# Patient Record
Sex: Female | Born: 1935 | Race: White | Hispanic: No | Marital: Single | Smoking: Never smoker
Health system: Southern US, Community
[De-identification: ages and names within clinical notes are randomized; demographics above are authoritative.]

## PROBLEM LIST (undated history)

## (undated) DIAGNOSIS — I10 Essential (primary) hypertension: Secondary | ICD-10-CM

## (undated) DIAGNOSIS — Z96649 Presence of unspecified artificial hip joint: Secondary | ICD-10-CM

## (undated) DIAGNOSIS — Z9882 Breast implant status: Secondary | ICD-10-CM

## (undated) DIAGNOSIS — M1990 Unspecified osteoarthritis, unspecified site: Secondary | ICD-10-CM

---

## 2011-10-18 ENCOUNTER — Ambulatory Visit (HOSPITAL_COMMUNITY): Payer: Medicare Other

## 2011-10-18 ENCOUNTER — Observation Stay (HOSPITAL_COMMUNITY)
Admission: RE | Admit: 2011-10-18 | Discharge: 2011-10-19 | Disposition: A | Discharge status: Home / Self Care | Payer: Medicare Other | Source: Ambulatory Visit | Attending: Otolaryngology | Admitting: Otolaryngology

## 2011-10-18 ENCOUNTER — Other Ambulatory Visit: Payer: Self-pay | Admitting: Otolaryngology

## 2011-10-18 DIAGNOSIS — Z01818 Encounter for other preprocedural examination: Secondary | ICD-10-CM | POA: Insufficient documentation

## 2011-10-18 DIAGNOSIS — J322 Chronic ethmoidal sinusitis: Secondary | ICD-10-CM | POA: Insufficient documentation

## 2011-10-18 DIAGNOSIS — J342 Deviated nasal septum: Secondary | ICD-10-CM | POA: Insufficient documentation

## 2011-10-18 DIAGNOSIS — Z01812 Encounter for preprocedural laboratory examination: Secondary | ICD-10-CM | POA: Insufficient documentation

## 2011-10-18 DIAGNOSIS — Z01810 Encounter for preprocedural cardiovascular examination: Secondary | ICD-10-CM | POA: Insufficient documentation

## 2011-10-18 DIAGNOSIS — J343 Hypertrophy of nasal turbinates: Secondary | ICD-10-CM | POA: Insufficient documentation

## 2011-10-18 DIAGNOSIS — G4730 Sleep apnea, unspecified: Principal | ICD-10-CM | POA: Insufficient documentation

## 2011-10-18 LAB — BASIC METABOLIC PANEL
BUN: 15 mg/dL (ref 6–23)
CO2: 30 mEq/L (ref 19–32)
Chloride: 100 mEq/L (ref 96–112)
Glucose, Bld: 93 mg/dL (ref 70–99)
Potassium: 4.6 mEq/L (ref 3.5–5.1)
Sodium: 138 mEq/L (ref 135–145)

## 2011-10-18 LAB — DIFFERENTIAL
Basophils Absolute: 0 10*3/uL (ref 0.0–0.1)
Basophils Relative: 0 % (ref 0–1)
Eosinophils Absolute: 0.1 K/uL (ref 0.0–0.7)
Eosinophils Relative: 2 % (ref 0–5)
Lymphocytes Relative: 29 % (ref 12–46)
Lymphs Abs: 1.9 K/uL (ref 0.7–4.0)
Monocytes Absolute: 0.6 10*3/uL (ref 0.1–1.0)
Monocytes Relative: 9 % (ref 3–12)
Neutro Abs: 4 10*3/uL (ref 1.7–7.7)
Neutrophils Relative %: 60 % (ref 43–77)

## 2011-10-18 LAB — CBC
HCT: 41.9 % (ref 36.0–46.0)
Hemoglobin: 13.4 g/dL (ref 12.0–15.0)
MCH: 28.5 pg (ref 26.0–34.0)
MCHC: 32 g/dL (ref 30.0–36.0)
MCV: 89.1 fL (ref 78.0–100.0)
Platelets: UNDETERMINED K/uL (ref 150–400)
RBC: 4.7 MIL/uL (ref 3.87–5.11)
RDW: 12.6 % (ref 11.5–15.5)
WBC: 6.7 K/uL (ref 4.0–10.5)

## 2011-10-18 LAB — BASIC METABOLIC PANEL WITH GFR
Calcium: 9.5 mg/dL (ref 8.4–10.5)
Creatinine, Ser: 0.91 mg/dL (ref 0.50–1.10)
GFR calc Af Amer: 70 mL/min — ABNORMAL LOW (ref >90)
GFR calc non Af Amer: 61 mL/min — ABNORMAL LOW (ref >90)

## 2011-10-18 LAB — SURGICAL PCR SCREEN
MRSA, PCR: NEGATIVE
Staphylococcus aureus: NEGATIVE

## 2011-10-19 NOTE — Progress Notes (Deleted)
[id number] 

## 2011-10-21 NOTE — Op Note (Signed)
Charlene Ellis, Charlene Ellis                  ACCOUNT NO.:  [id number]  MEDICAL RECORD NO.:  [id number]  LOCATION:  SDSC                         FACILITY:  [facility]  PHYSICIAN:  Hermelinda Medicus, M.D.   DATE OF BIRTH:  [birth year]  DATE OF PROCEDURE:  10/18/2011 DATE OF DISCHARGE:                              OPERATIVE REPORT   PREOPERATIVE DIAGNOSES:  Failed continuous positive airway pressure with sleep apnea.  Lowest SaO2 was 78.  Apnea-hypopnea index was 95.9.  The setting of continuous positive airway pressure was 17 cm of water, indicating severe nasal obstruction, and in fact, she has a septal deviation, sinusitis left ethmoid, turbinate hypertrophy, and a very low uvula and palate.  POSTOPERATIVE DIAGNOSES:  Failed continuous positive airway pressure with sleep apnea.  Lowest SaO2 was 78.  Apnea-hypopnea index was 95.9. The setting of continuous positive airway pressure was 17 cm of water, indicating severe nasal obstruction, and in fact, she has a septal deviation, sinusitis left ethmoid, turbinate hypertrophy, and a very low uvula and palate.  OPERATION:  A septal reconstruction, turbinate reduction, a left anterior ethmoidectomy, and a uvulopalatoplasty under general endotracheal anesthesia.  SURGEON:  Hermelinda Medicus, MD  ANESTHESIOLOGIST:  Dr. Gelene Mink.  The patient is aware of risks and gains.  She is going to be kept in a step-down unit postop just to check her O2 postoperatively, but the gain in her airway should be considerable and we also will use anesthesia trumpet to protect her airway during this initial wake-up time.  She will have some pain in her palate and uvula, which will last approximately 10 days and she will have to be on a soft diet.  We hope with this we will able to avoid the CPAP totally.  If we need CPAP in the future in the setting of the CPAP should be markedly improved approaching 7-8 or 9 cm of water which is more typical.  DESCRIPTION OF PROCEDURE:   The patient was in supine position under general endotracheal anesthesia, the patient was prepped and draped in the appropriate using Hibiclens and usual head drape.  The nose was first anesthetized with 1% Xylocaine with epinephrine and topical cocaine 200 mg, and the inferior turbinates were aggressively outfractured gaining considerable space at this time.  We then made a hemitransfixion incision on the left side and worked back taking down a very heavily deviated ethmoid and vomerine septum using the open and closed Jansen-Middleton Takahashi forceps and then the 4-mm chisel was used to take down the vomerine spur and that once we accomplished that we were to bring the septum back to the midline and gained considerable space.  We also used the Elmed to decrease the turbinate hypertrophy. We closed the septal deviation with 4-0 through and through septal suture and then we approached the left ethmoid using the 0 degree scope and pushed the middle turbinate medial, entered the ethmoid finding considerable polypoid debris within the ethmoid sinus.  Once we accomplished this, we then placed a Gelfoam within the ethmoid sinus. To keep the turbinate medial, to keep the sinus opening.  We then reoriented herself to the palate where using the Crowe-Davis mouth gag. We  trimmed the uvula and the palate gaining about 7 mm of height in an effort to get a better oral airway as she did have a rather large high tongue and a fairly small mouth.  The patient tolerated this well.  All hemostasis was established with Bovie electrocoagulation.  Once this was completed, we then removed the Crowe-Davis mouth gag and reordered ourselves to the nose where any bleeding was checked.  The bleeding was also checked around the palate and then we placed a Merocel pack on the left, a 10 cm, and then 7 anesthesia trumpet on the right to ensure her airway.  She was taken to recovery room in good condition. Blood  loss was approximately 20 mL.  She will be kept in the Step-Down Unit for postop observation of her sleep apnea, and hopefully she will be a 23- hour observation.  Her follow up will be in 3 days a week, 2 weeks, 3 weeks, 6 weeks, and 6 months a year.          ______________________________ Hermelinda Medicus, M.D.     JC/MEDQ  D:  10/18/2011  T:  10/18/2011  Job:  [id number]  cc:   Dr. Feliciana Rossetti Dr. Alton Revere  Electronically Signed by Hermelinda Medicus M.D. on 10/21/2011 11:12:43 AM

## 2011-10-21 NOTE — H&P (Signed)
  NAMEDAKARI, STABLER                  ACCOUNT NO.:  [id number]  MEDICAL RECORD NO.:  [id number]  LOCATION:  SDSC                         FACILITY:  [facility]  PHYSICIAN:  Hermelinda Medicus, M.D.   DATE OF BIRTH:  1935-01-27  DATE OF ADMISSION:  10/18/2011 DATE OF DISCHARGE:                             HISTORY & PHYSICAL   HISTORY OF PRESENT ILLNESS:  This patient is a 76 year old female who has had great difficulty with her sleep apnea issues.  She was set up with CPAP but had a set at 17 pressure and pressure is a centimeter of water.  She also had a 95.9 AHI with 78% O2 nadir.  She could not tolerate this as she had a severe septal deviation, and a turbinate hypertrophy.  She also has left ethmoid sinusitis.  She also has a very low uvula and long uvula and palate typical of a snore.  She has abandon her CPAP because she just cannot use it.  She now enters for septal reconstruction to reestablish her nasal airway with turbinate reduction as well as a left anterior ethmoidectomy as well as a uvulopalatoplasty. Her past history is unremarkable and the fact she has no allergies to medications.  She has had a hysterectomy, bilateral breast surgery in after birth, and she had a T and A at age 36 and bilateral cataract surgery.  She also never smoked or drink.  She has had no cardiac problems.  She has never had a stress test.  No echocardiogram.  She does take OxyContin if she does have some neck cervical C5-6 disk issues.  Apparently, there is some arthritis there.  She also was stated that she may have some fibromyalgia.  She told decrease in her sleep or have sleep deprivation that she is somewhat depressed and has been on medication for this.  Her lab is unremarkable.  She has normal lab status and her EKG showed a normal sinus rhythm.  PHYSICAL EXAMINATION:  GENERAL:  She is a well-nourished, somewhat anxious lady. VITAL SIGNS:  Blood pressure of 131/84, pulse 73, SaO2 resting is  93%. HEENT:  She has a septal deviation, turbinate hypertrophy with left ethmoid sinusitis as seen on CAT scan.  She has a very low palate and uvula, quite large tongue. CHEST:  Clear.  No rales, rhonchi, or wheezes. CARDIOVASCULAR:  Normal S1 and S2.  No murmurs or gallops with normal EKG. EXTREMITIES:  Essentially are unremarkable.  Her initial diagnosis is sleep apnea with failed CPAP with severe septal deviation, turbinate hypertrophy, left ethmoid sinusitis with a low palate and uvula with sleep deprivation.  History of cataract surgery. History of tonsillectomy.  History of breast surgery as a child.  Our plan is to do a septal reconstruction, turbinate reduction, left anterior ethmoidectomy and uvulopalatoplasty.          ______________________________ Hermelinda Medicus, M.D.     JC/MEDQ  D:  10/18/2011  T:  10/18/2011  Job:  [id number]  cc:   Feliciana Rossetti, MD Albertina Senegal, PA  Electronically Signed by Hermelinda Medicus M.D. on 10/21/2011 11:12:47 AM

## 2011-10-21 NOTE — Discharge Summary (Signed)
Charlene Ellis, Charlene Ellis                  ACCOUNT NO.:  [id number]  MEDICAL RECORD NO.:  [id number]  LOCATION:  3307                         FACILITY:  [facility]  PHYSICIAN:  Hermelinda Medicus, M.D.   DATE OF BIRTH:  03/18/1935  DATE OF ADMISSION:  10/18/2011 DATE OF DISCHARGE:  10/19/2011                              DISCHARGE SUMMARY   This patient is a 76 year old female who has had great difficulty with her sleep apnea issues.  She was set up with CPAP, was set at 17 pressure and cm of water.  She also had a 95.9 AHI with 78% O2 nadir and could not tolerate her CPAP because of the high pressure.  It was set that high because she has a very deviated septum and blocking of her nose, has turbinate hypertrophy.  She has abandoned the CPAP and just given up on it and just gets a very poor night's sleep.  Her polysomnogram showed she had no time in deep or in REM sleep, and she is chronically fatigued with sleep deprivation.  She now enters for a septal reconstruction and anterior ethmoidectomy as the CAT scan done of her sinuses has shown ethmoid sinusitis, and she will have a uvulopalatoplasty to upgrade or to increase the elevation of her palate so that her high-arched tongue will not cause obstruction in the oral cavity.  Her past history is quite unremarkable.  She has no medication allergies.  She has had a hysterectomy, bilateral breast surgery, T and A as a child at 55, bilateral cataract surgeries.  She has never smoked or drunk and she has no cardiac problems with a normal EKG, but she has not had a stress test or an echocardiogram.  She does take OxyContin because she has neck or cervical C5 __________ disk issues but has never had surgery there.  She also has some arthritis.  She also has a history of fibromyalgia.  With the sleep deprivation, I think she is having more fibromyalgia issues, also some depression issues.  Her EKG is within normal limits.  Her laboratory is also  quite excellent.  She has a white count of 6.7, hemoglobin of 13.4, hematocrit 41.9, and a normal differential.  Her sodium 138, potassium 4.6.  She has also had otherwise a normal status except for her GFR which was 61.  She also has no MRSA history.  She underwent a septal reconstruction, turbinate reduction with a left anterior ethmoidectomy and a uvulopalatoplasty under general endotracheal anesthesia, and had a __________ packing in her right nose and a Merocel in the left, and she was observed on the floor of 3300 which is a step-down unit because of her sleep apnea and found to have a O2 nadir of 90 but primarily 94-96% on room air.  Her blood pressure has been stable between 127/61 and 158/61.  She had done extremely well. Her pack has been removed this morning.  She will be followed in my office tomorrow just for cleansing of her nose, and she will then be followed in 1 week, 3 weeks, 6 weeks, 3 months, and 6 months.  FINAL DIAGNOSIS: 1. Sleep apnea with septal deviation with left ethmoid  sinusitis with     redundant palate with failed continuous positive airway pressure. 2. History of breast surgery. 3. History of tonsillectomy and adenoidectomy. 4. History of C5-C6 arthritis. 5. History of fibromyalgia. 6. History of a mild depression.  Her situation is markedly improved, breathing is far better.  She is already much happier than preop, and she has a normal EKG.  I discussed with family and her daughter who will care postoperatively, and I am expecting her to do extremely well.          ______________________________ Hermelinda Medicus, M.D.     JC/MEDQ  D:  10/19/2011  T:  10/19/2011  Job:  [id number]  cc:   Dr. Feliciana Rossetti, [facility] Dr. Albertina Senegal, [facility]  Electronically Signed by Hermelinda Medicus M.D. on 10/21/2011 11:12:51 AM

## 2015-07-17 ENCOUNTER — Encounter (HOSPITAL_COMMUNITY): Payer: Self-pay | Admitting: Radiology

## 2015-07-17 ENCOUNTER — Inpatient Hospital Stay (HOSPITAL_COMMUNITY): Payer: PPO

## 2015-07-17 ENCOUNTER — Inpatient Hospital Stay (HOSPITAL_COMMUNITY)
Admission: EM | Admit: 2015-07-17 | Discharge: 2015-08-19 | DRG: 064 | Disposition: E | Payer: PPO | Attending: Pulmonary Disease | Admitting: Pulmonary Disease

## 2015-07-17 ENCOUNTER — Emergency Department (HOSPITAL_COMMUNITY): Payer: PPO

## 2015-07-17 DIAGNOSIS — R739 Hyperglycemia, unspecified: Secondary | ICD-10-CM | POA: Diagnosis not present

## 2015-07-17 DIAGNOSIS — I10 Essential (primary) hypertension: Secondary | ICD-10-CM | POA: Diagnosis present

## 2015-07-17 DIAGNOSIS — W06XXXA Fall from bed, initial encounter: Secondary | ICD-10-CM | POA: Diagnosis present

## 2015-07-17 DIAGNOSIS — Z22322 Carrier or suspected carrier of Methicillin resistant Staphylococcus aureus: Secondary | ICD-10-CM

## 2015-07-17 DIAGNOSIS — I6522 Occlusion and stenosis of left carotid artery: Secondary | ICD-10-CM | POA: Diagnosis not present

## 2015-07-17 DIAGNOSIS — R4701 Aphasia: Secondary | ICD-10-CM | POA: Diagnosis not present

## 2015-07-17 DIAGNOSIS — R001 Bradycardia, unspecified: Secondary | ICD-10-CM | POA: Diagnosis not present

## 2015-07-17 DIAGNOSIS — G8191 Hemiplegia, unspecified affecting right dominant side: Secondary | ICD-10-CM | POA: Diagnosis present

## 2015-07-17 DIAGNOSIS — Z515 Encounter for palliative care: Secondary | ICD-10-CM | POA: Diagnosis not present

## 2015-07-17 DIAGNOSIS — R1310 Dysphagia, unspecified: Secondary | ICD-10-CM | POA: Diagnosis not present

## 2015-07-17 DIAGNOSIS — I63412 Cerebral infarction due to embolism of left middle cerebral artery: Principal | ICD-10-CM | POA: Diagnosis present

## 2015-07-17 DIAGNOSIS — I63512 Cerebral infarction due to unspecified occlusion or stenosis of left middle cerebral artery: Secondary | ICD-10-CM | POA: Insufficient documentation

## 2015-07-17 DIAGNOSIS — Z7982 Long term (current) use of aspirin: Secondary | ICD-10-CM | POA: Diagnosis not present

## 2015-07-17 DIAGNOSIS — G936 Cerebral edema: Secondary | ICD-10-CM | POA: Diagnosis present

## 2015-07-17 DIAGNOSIS — Z79899 Other long term (current) drug therapy: Secondary | ICD-10-CM | POA: Diagnosis not present

## 2015-07-17 DIAGNOSIS — J90 Pleural effusion, not elsewhere classified: Secondary | ICD-10-CM | POA: Diagnosis not present

## 2015-07-17 DIAGNOSIS — I481 Persistent atrial fibrillation: Secondary | ICD-10-CM | POA: Diagnosis not present

## 2015-07-17 DIAGNOSIS — M1990 Unspecified osteoarthritis, unspecified site: Secondary | ICD-10-CM | POA: Diagnosis present

## 2015-07-17 DIAGNOSIS — G4733 Obstructive sleep apnea (adult) (pediatric): Secondary | ICD-10-CM | POA: Diagnosis not present

## 2015-07-17 DIAGNOSIS — E785 Hyperlipidemia, unspecified: Secondary | ICD-10-CM | POA: Insufficient documentation

## 2015-07-17 DIAGNOSIS — I639 Cerebral infarction, unspecified: Secondary | ICD-10-CM | POA: Diagnosis present

## 2015-07-17 DIAGNOSIS — Z7189 Other specified counseling: Secondary | ICD-10-CM

## 2015-07-17 DIAGNOSIS — J9601 Acute respiratory failure with hypoxia: Secondary | ICD-10-CM

## 2015-07-17 DIAGNOSIS — Y939 Activity, unspecified: Secondary | ICD-10-CM | POA: Diagnosis not present

## 2015-07-17 DIAGNOSIS — T17998A Other foreign object in respiratory tract, part unspecified causing other injury, initial encounter: Secondary | ICD-10-CM | POA: Diagnosis not present

## 2015-07-17 DIAGNOSIS — Z79891 Long term (current) use of opiate analgesic: Secondary | ICD-10-CM

## 2015-07-17 DIAGNOSIS — Z09 Encounter for follow-up examination after completed treatment for conditions other than malignant neoplasm: Secondary | ICD-10-CM

## 2015-07-17 DIAGNOSIS — E876 Hypokalemia: Secondary | ICD-10-CM | POA: Diagnosis present

## 2015-07-17 DIAGNOSIS — I272 Other secondary pulmonary hypertension: Secondary | ICD-10-CM | POA: Diagnosis not present

## 2015-07-17 DIAGNOSIS — I5032 Chronic diastolic (congestive) heart failure: Secondary | ICD-10-CM | POA: Diagnosis present

## 2015-07-17 DIAGNOSIS — R451 Restlessness and agitation: Secondary | ICD-10-CM | POA: Diagnosis not present

## 2015-07-17 DIAGNOSIS — Z4659 Encounter for fitting and adjustment of other gastrointestinal appliance and device: Secondary | ICD-10-CM

## 2015-07-17 DIAGNOSIS — Z66 Do not resuscitate: Secondary | ICD-10-CM | POA: Diagnosis present

## 2015-07-17 DIAGNOSIS — T17908A Unspecified foreign body in respiratory tract, part unspecified causing other injury, initial encounter: Secondary | ICD-10-CM

## 2015-07-17 DIAGNOSIS — R509 Fever, unspecified: Secondary | ICD-10-CM | POA: Diagnosis not present

## 2015-07-17 DIAGNOSIS — I6789 Other cerebrovascular disease: Secondary | ICD-10-CM | POA: Diagnosis not present

## 2015-07-17 DIAGNOSIS — Z452 Encounter for adjustment and management of vascular access device: Secondary | ICD-10-CM

## 2015-07-17 DIAGNOSIS — I158 Other secondary hypertension: Secondary | ICD-10-CM | POA: Diagnosis not present

## 2015-07-17 DIAGNOSIS — G9340 Encephalopathy, unspecified: Secondary | ICD-10-CM | POA: Diagnosis present

## 2015-07-17 HISTORY — DX: Breast implant status: Z98.82

## 2015-07-17 HISTORY — DX: Presence of unspecified artificial hip joint: Z96.649

## 2015-07-17 HISTORY — DX: Essential (primary) hypertension: I10

## 2015-07-17 HISTORY — DX: Unspecified osteoarthritis, unspecified site: M19.90

## 2015-07-17 LAB — CBC
HEMATOCRIT: 34.5 % — AB (ref 36.0–46.0)
Hemoglobin: 11.7 g/dL — ABNORMAL LOW (ref 12.0–15.0)
MCH: 29.3 pg (ref 26.0–34.0)
MCHC: 33.9 g/dL (ref 30.0–36.0)
MCV: 86.3 fL (ref 78.0–100.0)
Platelets: DECREASED 10*3/uL (ref 150–400)
RBC: 4 MIL/uL (ref 3.87–5.11)
RDW: 13.8 % (ref 11.5–15.5)
WBC: 9.8 10*3/uL (ref 4.0–10.5)

## 2015-07-17 LAB — CBG MONITORING, ED: Glucose-Capillary: 131 mg/dL — ABNORMAL HIGH (ref 65–99)

## 2015-07-17 LAB — BASIC METABOLIC PANEL
ANION GAP: 10 (ref 5–15)
BUN: 15 mg/dL (ref 6–20)
CHLORIDE: 109 mmol/L (ref 101–111)
CO2: 19 mmol/L — ABNORMAL LOW (ref 22–32)
Calcium: 9 mg/dL (ref 8.9–10.3)
Creatinine, Ser: 0.87 mg/dL (ref 0.44–1.00)
GFR calc non Af Amer: >60 mL/min (ref >60)
Glucose, Bld: 146 mg/dL — ABNORMAL HIGH (ref 65–99)
POTASSIUM: 3.7 mmol/L (ref 3.5–5.1)
Sodium: 138 mmol/L (ref 135–145)

## 2015-07-17 LAB — I-STAT CG4 LACTIC ACID, ED: LACTIC ACID, VENOUS: 1.58 mmol/L (ref 0.5–2.0)

## 2015-07-17 LAB — I-STAT TROPONIN, ED: TROPONIN I, POC: 0.01 ng/mL (ref 0.00–0.08)

## 2015-07-17 LAB — PROTIME-INR
INR: 1.12 (ref 0.00–1.49)
PROTHROMBIN TIME: 14.6 s (ref 11.6–15.2)

## 2015-07-17 LAB — SODIUM
Sodium: 141 mmol/L (ref 135–145)
Sodium: 141 mmol/L (ref 135–145)

## 2015-07-17 LAB — MRSA PCR SCREENING: MRSA BY PCR: POSITIVE — AB

## 2015-07-17 MED ORDER — IOHEXOL 350 MG/ML SOLN
90.0000 mL | Freq: Once | INTRAVENOUS | Status: AC | PRN
  Administered 2015-07-17: 90 mL via INTRAVENOUS

## 2015-07-17 MED ORDER — HEPARIN SODIUM (PORCINE) 5000 UNIT/ML IJ SOLN
5000.0000 [IU] | Freq: Three times a day (TID) | INTRAMUSCULAR | Status: DC
  Administered 2015-07-17 – 2015-07-21 (×12): 5000 [IU] via SUBCUTANEOUS
  Filled 2015-07-17 (×14): qty 1

## 2015-07-17 MED ORDER — CHLORHEXIDINE GLUCONATE CLOTH 2 % EX PADS
6.0000 | MEDICATED_PAD | Freq: Every day | CUTANEOUS | Status: DC
Start: 2015-07-18 — End: 2015-07-21
  Administered 2015-07-18 – 2015-07-20 (×3): 6 via TOPICAL

## 2015-07-17 MED ORDER — STROKE: EARLY STAGES OF RECOVERY BOOK
Freq: Once | Status: DC
  Filled 2015-07-17: qty 1

## 2015-07-17 MED ORDER — ASPIRIN 300 MG RE SUPP
300.0000 mg | Freq: Every day | RECTAL | Status: DC
  Administered 2015-07-17 – 2015-07-18 (×2): 300 mg via RECTAL
  Filled 2015-07-17 (×5): qty 1

## 2015-07-17 MED ORDER — WHITE PETROLATUM GEL
Status: AC
  Administered 2015-07-17
  Filled 2015-07-17: qty 1

## 2015-07-17 MED ORDER — ASPIRIN EC 81 MG PO TBEC
81.0000 mg | DELAYED_RELEASE_TABLET | Freq: Every day | ORAL | Status: DC
Start: 2015-07-17 — End: 2015-07-17

## 2015-07-17 MED ORDER — SODIUM CHLORIDE 3 % IV SOLN
INTRAVENOUS | Status: DC
  Administered 2015-07-17 – 2015-07-19 (×4): 50 mL/h via INTRAVENOUS
  Filled 2015-07-17 (×10): qty 500

## 2015-07-17 MED ORDER — ASPIRIN 325 MG PO TABS
325.0000 mg | ORAL_TABLET | Freq: Every day | ORAL | Status: DC
  Administered 2015-07-19 – 2015-07-21 (×3): 325 mg via ORAL
  Filled 2015-07-17 (×6): qty 1

## 2015-07-17 MED ORDER — MUPIROCIN 2 % EX OINT
1.0000 | TOPICAL_OINTMENT | Freq: Two times a day (BID) | CUTANEOUS | Status: DC
Start: 2015-07-17 — End: 2015-07-21
  Administered 2015-07-17 – 2015-07-21 (×8): 1 via NASAL
  Filled 2015-07-17 (×2): qty 22

## 2015-07-17 NOTE — Code Documentation (Addendum)
Per daughter, Patient was last known well at 1800 yesterday.  She was vacuuming when she suddenly had right sided weakness and difficulty speaking.  Daughter called EMS and she was transported to [facility].  Per daughter she never returned to baseline.  This morning at home the patient fell when she tried to get out of bed by herself.  EMS was again called and pt taken to [facility].  Head CT and labs were done at [facility].  Code Stroke called and patient transported to [facility].  Upon arrival patient alert, left gaze preference, Right flaccid, Mute, facial droop, visual field cut and neglect.  NIHSS 25  CT perfusion and CTA head and neck done.  Dr Leroy Kennedy at bedside to assess patient.  Daughter at bedside.

## 2015-07-17 NOTE — Procedures (Signed)
Central Venous Catheter Insertion Procedure Note SAE HANDRICH [id number] Oct 14, 1935  Procedure: Insertion of Central Venous Catheter Indications: Drug and/or fluid administration and Frequent blood sampling  Procedure Details Consent: Risks of procedure as well as the alternatives and risks of each were explained to the (patient/caregiver).  Consent for procedure obtained. Time Out: Verified patient identification, verified procedure, site/side was marked, verified correct patient position, special equipment/implants available, medications/allergies/relevent history reviewed, required imaging and test results available.  Performed  Maximum sterile technique was used including antiseptics, cap, gloves, gown, hand hygiene, mask and sheet. Skin prep: Chlorhexidine; local anesthetic administered A antimicrobial bonded/coated triple lumen catheter was placed in the left subclavian vein using the Seldinger technique.  Evaluation Blood flow good Complications: No apparent complications Patient did tolerate procedure well. Chest X-ray ordered to verify placement.  CXR: showed tip in R atrium, will plan to withdraw 2 cm.Marland Kitchen  Ronna Herskowitz 2015/08/12, 10:08 PM

## 2015-07-17 NOTE — ED Notes (Signed)
Pt in from [facility] via [facility], per report pt fell this morning at her home, per EMS the pt was awake & sitting on the bed at home, her daughter went to the bathroom & heard her mother fall, pt was sent via EMS to [facility] d/t Altered LOC, L sided gaze & R sided neglect with the exception of response to pain, today's initial call was @ 7-7:30 this am, pt reported to be seen yesterday & [facility] hospital with dx of TIA & sent home after deficits resolved, pt presented to this ED Alert, with no speech, flexion to pain on the R side, follows some commands on the L side, no facial droop, pt baseline A&O x4, daughter in route, per EMS pt has 100% blockage of L carotid artery dx today at [facility]

## 2015-07-17 NOTE — Progress Notes (Signed)
Patient transferred from ER via stretcher on tele and oxygen by ER RN. Patient alert, aphasic. Bed alarm on. Family updated on POC to move to ICU for further treatment/monitoring per MD orders.

## 2015-07-17 NOTE — Progress Notes (Signed)
Family Meeting Note  Met with patient's son and two daughters. Explained natural history of large stroke and how disability is usually a greater threat than death. Explained location and expected disability of L MCA stroke. Explained that patient is not convincingly protecting her airway and that intubation would be necessary to ensure airway protection. Also explained that could make assessment of improvement from acute stroke difficult. The siblings reiterated that the patient would not want to live with significant neurologic impairment and would rather not survive such an event than survive with severe debility. However, they believed that she deserved a "fair chance" at recovery. Given this, I reccomended a moderate degree of aggressive care, proceeding with central line, 3% saline, antiplatelet, etc. but not persuing intubation, since that would be a strong indicator of severe long-term debility. Additional, this will allow for greater ease of assessment of recovery. The family readily agreed to this plan of care.  Patient will be DNR/DNI.  Luz Brazen, MD Pulmonary & Critical Care Medicine July 17, 2015, 10:04 PM

## 2015-07-17 NOTE — Consult Note (Addendum)
Referring Physician: ED    Chief Complaint: code stroke, altered mental status, right hemiplegia, left gaze preference, global aphasia, left ICA occlusion on carotid ultrasound.  HPI:                                                                                                                                         Charlene Ellis is an 80 y.o. female without significant past medical history, brought in via EMS for evaluation of the above stated symptoms/signs. Patient was initially evaluated at [facility] ED who contacted our interventional neuroradiologist who recommended activating the code stroke and transferring  patient to [facility] for possible endovascular intervention. Patient daughter is at the bedside and said that she last saw Charlene Ellis normal around 6 pm yesterday, then by 7 pm she noticed that she was poorly responsive, unable to communicate ,and not having normal mobility in her right side and thus she decided to seek medical attention. As per daughter, patient had CT brain and blood testing and released from [facility] with a diagnosis of " a minor stroke". However, she stresses the fact that her mother symptoms never resolved completely.  Then, this morning around 7 am she fell out the bed and was found unresponsive, unable to speak, not moving the right side. EMS was called and patient seen again at [facility] where she had a repeat CT brain that was unremarkable for acute abnormality but CUS revealed a left ICA occluded by a clot. NIHSS 25. CTA neck: the left common carotid artery is occluded 2.5 cm above the arch. The very distal left ICA terminus is opacified without contrast in the cavernous segment. CTA brain: large left MCA territory infarct. Dense left MCA occlusion without significant collaterals. High-grade stenosis of the non dominant proximal right vertebral artery. CTP demonstrated a large core infarct involving the majority of the left MCA territory with a smaller penumbra  along the margin of the left temporal lobe.    Date last known well: 07/16/15 Time last known well: 6 pm tPA Given: no, out of the window NIHSS: 25 MRS: 0   History reviewed. No pertinent past medical history.  No past surgical history on file.  No family history on file. Social History:  has no tobacco, alcohol, and drug history on file.  Allergies: No Known Allergies  Medications:  I have reviewed the patient's current medications.  ROS: unable to obtain due to mental status                                                                                                                                     History obtained from chart review and daughter   Physical exam: elderly acute ill female in no apparent distress. Blood pressure 180/66, pulse 95, temperature 98 F (36.7 C), temperature source Oral, resp. rate 25, SpO2 97 %. Head: normocephalic. Neck: supple, no bruits, no JVD. Cardiac: no murmurs. Lungs: clear. Abdomen: soft, no tender, no mass. Extremities: no edema. Skin: no rash  Neurologic Examination:                                                                                                      General: Mental Status: Awake but with global aphasia.  Cranial Nerves: II: Discs couldn't be assessed; VF impaired in the right, pupils equal, round, reactive to light III,IV, VI: ptosis not present, left gaze preference V,VII: smile asymmetric due to right face weakness, facial light touch sensation unreliable VIII: hearing grossly appropriate IX,X: uvula rises symmetrically XI: bilateral shoulder shrug no tested XII: midline tongue extension without atrophy or fasciculations  Motor: Dense right hemiplegia Tone decreased in the right side Sensory: reacts to pain but doesn't localize Deep Tendon Reflexes:  1+ all over Plantars: Right:  upgoing   Left: upgoing Cerebellar: Unable to assess due to mental status Gait:  Unable to assess due to mental status    Results for orders placed or performed during the hospital encounter of 08-09-2015 (from the past 48 hour(s))  CBG monitoring, ED     Status: Abnormal   Collection Time: August 09, 2015  3:02 PM  Result Value Ref Range   Glucose-Capillary 131 (H) 65 - 99 mg/dL   Comment 1 Notify RN    Comment 2 Document in Chart   I-Stat CG4 Lactic Acid, ED     Status: None   Collection Time: 09-Aug-2015  3:10 PM  Result Value Ref Range   Lactic Acid, Venous 1.58 0.5 - 2.0 mmol/L   No results found.    Assessment: 80 y.o. female with large left MCA territory infarction that seems to be embolic. NIHSS 25.  CTA brain showed dense left MCA occlusion without significant collaterals. CTA neck with occlusion of the left common carotid artery 2.5 cm above the arch and the very distal left ICA terminus is opacified without contrast in  the cavernous segment. Patient is out of the window for IV thrombolysis or endovascular intervention. We pursued CTP under the assumption that she was last seen normal at 7 am today and we were dealing with a wake up stroke, but based on information provided by her daughter she in reality was last known well at 6 pm yesterday when she had similar symptoms and presented to [facility], and as per daughter never returned to her baseline. Additionally, CTP showed not evidence of significant salvageable brain tissue. I updated patient's daughter regarding the fact that unfortunately we have no available treatment at this point that will help to improve patient functional status. Furthermore, I emphasized the fact that her stroke is significantly large and with the potential for swelling of the brain that can result in deterioration of her neurological status requiring intubation and transfer to the ICU and she stated that in that scenario she wants everything done to preserve her mother  life. Case was also discussed with interventional neuroradiologist. Admit to stepdown. Complete stroke work up. Stroke team will follow up tomorrow.  Stroke Risk Factors -age  Plan: 1. HgbA1c, fasting lipid panel 2. MRI of the brain without contrast 3. Echocardiogram 4. Carotid dopplers (no need as CTA neck done) 5. Prophylactic therapy-aspirin 6. Risk factor modification 7. Telemetry monitoring 8. Frequent neuro checks 9. PT/OT SLP   Noemi Chapel ,MD [facility] [phone]  July 22, 2015, 3:18 PM  Addendum: MRI brain confirms a large left MCA acute nonhemorrhagic infarct with mass effect and 4 mm midline shift. Sparing of the left temporal lobe. Will start 3% hypertonic saline. Need to be watch closely tonight, thus recommended transfer to NICU.  Wyatt Portela, MD

## 2015-07-17 NOTE — Progress Notes (Signed)
Report given to Raynelle Fanning, [place]. Patient transferred to 3M02 via bed on tele. Family aware.

## 2015-07-17 NOTE — H&P (Addendum)
History and Physical    Charlene Ellis ZOX:[id number] DOB: 1935-03-21 DOA: 07/09/2015  Referring physician: Dr. Gwendolyn Grant PCP: No primary care provider on file.  Specialists: neurology, Dr. Cyril Mourning  Chief Complaint: code stroke  HPI: Charlene Ellis is a 80 y.o. female has a past medical history significant for HTN, HLD, was brought to [facility] ED as a code stroke. In the ED patient is obtunded, can minimally follow some commands, she is non verbal. Her daughter is in the room and states that she went to [facility] ED last night with right sided weakness, she was diagnosed with a TIA and discharged home. Daughter states that she never returned to normal and this morning she heard the patient falling and found her poorly responsive. She went again to [facility] ER and she was the transferred here as a code stroke. Neurology evaluated patient on arrival, no tPA given. She underwent CT angio which showed large left MCA territory infarct. Her labs are fairly unremarkable. TRH asked for SDU admission in the setting of stroke.   Review of Systems: unable to obtain ROS due to acute encephalopathy  PMH  Arthritis HTN "hip arthritis"  Social History - per records no reported tobacco, alcohol or drugs  No Known Allergies  Unable to obtain family history due to obtunded status  Prior to Admission medications   Medication Sig Start Date End Date Taking? Authorizing Provider  aspirin EC 325 MG tablet Take 325 mg by mouth daily.   Yes Historical Provider, MD  estradiol (ESTRACE) 2 MG tablet Take 2 mg by mouth daily.     Yes Historical Provider, MD  Fe Fum-Fe Poly-Vit C-Lactobac (FUSION) 65-65-25-30 MG CAPS Take 1 capsule by mouth daily.   Yes Historical Provider, MD  furosemide (LASIX) 20 MG tablet Take 20 mg by mouth daily.   Yes Historical Provider, MD  hydroxypropyl methylcellulose (ISOPTO TEARS) 2.5 % ophthalmic solution Place 1 drop into both eyes 4 (four) times daily as needed. For dry eyes     Historical  Provider, MD  losartan (COZAAR) 100 MG tablet Take 100 mg by mouth daily.   Yes Historical Provider, MD  lovastatin (MEVACOR) 10 MG tablet Take 10 mg by mouth at bedtime.   Yes Historical Provider, MD  omeprazole (PRILOSEC) 20 MG capsule Take 20 mg by mouth daily.   Yes Historical Provider, MD  oxyCODONE (ROXICODONE) 15 MG immediate release tablet Take 15 mg by mouth every 4 (four) hours as needed for pain.   Yes Historical Provider, MD  potassium chloride SA (K-DUR,KLOR-CON) 20 MEQ tablet Take 20 mEq by mouth daily.   Yes Historical Provider, MD  promethazine (PHENERGAN) 25 MG tablet Take 25 mg by mouth 2 (two) times daily.   Yes Historical Provider, MD  ranitidine (ZANTAC) 150 MG tablet Take 150 mg by mouth 2 (two) times daily.   Yes Historical Provider, MD  senna (SENOKOT) 8.6 MG tablet Take 1 tablet by mouth daily.   Yes Historical Provider, MD  sertraline (ZOLOFT) 100 MG tablet Take 100 mg by mouth 2 (two) times daily.    Yes Historical Provider, MD  temazepam (RESTORIL) 15 MG capsule Take 15 mg by mouth at bedtime.   Yes Historical Provider, MD  topiramate (TOPAMAX) 50 MG tablet Take 50 mg by mouth 2 (two) times daily.   Yes Historical Provider, MD  Vitamin D, Ergocalciferol, (DRISDOL) 50000 UNITS CAPS capsule Take 50,000 Units by mouth every 7 (seven) days. Take on Mondays   Yes Historical  Provider, MD   Physical Exam: Filed Vitals:   07/08/2015 1515 06/22/2015 1526 07/01/2015 1530 07/13/2015 1545  BP:  168/57 145/56 158/59  Pulse: 95 68 67 67  Temp:      TempSrc:      Resp: SpO2: 97% 96% 96% 97%    General:  lethargic  Eyes: pupils 3-4 mm, reactive bilaterally, no scleral icterus  Neck: supple, no lymphadenopathy  Cardiovascular: regular rate without MRG; 2+ peripheral pulses, no JVD, trace peripheral edema  Respiratory: no wheezing or crackles on anterior auscultation  Abdomen: soft, positive bowel sounds, no guarding, appears non tender  Skin: no  rashes  Musculoskeletal: normal bulk and tone  Psychiatric: obtunded  Neurologic: right hemiplegia, spontaneously moves left leg and arm, non verbal, opens eyes intermittently  Labs on Admission:  Basic Metabolic Panel:  Recent Labs Lab 06/30/2015 1501  NA 138  K 3.7  CL 109  CO2 19*  GLUCOSE 146*  BUN 15  CREATININE 0.87  CALCIUM 9.0   CBC:  Recent Labs Lab 07/09/2015 1501  WBC 9.8  HGB 11.7*  HCT 34.5*  MCV 86.3  PLT PENDING   CBG:  Recent Labs Lab 06/27/2015 1502  GLUCAP 131*   Radiological Exams on Admission: Ct Angio Head W/cm &/or Wo Cm  06/22/2015   CLINICAL DATA:  Flaccid paralysis on the right. Code stroke. Patient is unresponsive.  EXAM: CT ANGIOGRAPHY HEAD AND NECK  TECHNIQUE: Multidetector CT imaging of the head and neck was performed using the standard protocol during bolus administration of intravenous contrast. Multiplanar CT image reconstructions and MIPs were obtained to evaluate the vascular anatomy. Carotid stenosis measurements (when applicable) are obtained utilizing NASCET criteria, using the distal internal carotid diameter as the denominator.  CONTRAST:  90mL OMNIPAQUE IOHEXOL 350 MG/ML SOLN  COMPARISON:  CT head without contrast from the same day. Carotid Doppler study from the same day.  FINDINGS: CT HEAD  Brain: A large left MCA territory infarct is no more evident. This involves the posterior left lentiform nucleus, insular cortex, and extensive cortex over the convexity. The left ACA and PCA territories are spared. Extensive periventricular and subcortical white matter hypoattenuation is additionally noted.  Calvarium and skull base: Within normal limits  Paranasal sinuses: Clear  Orbits: Bilateral lens replacements are noted. The globes and orbits are otherwise intact.  CTA NECK  Aortic arch: A 3 vessel arch configuration is present. Calcifications are present at the origin of the left subclavian artery without significant stenosis.  Right carotid  system: Atherosclerotic changes are noted at the origin of the right common carotid artery from the innominate artery with less than 50% stenosis. The lumen is narrowed to 4 mm.  The remainder the right common carotid artery is within normal limits. Minimal calcifications are present at the carotid bifurcation. There is mild tortuosity of the cervical right ICA without significant focal stenosis.  Left carotid system: The left internal carotid artery is occluded 2.5 cm from its origin. There is no significant reconstitution in the neck. There is some opacification of the left ECA branch vessels distally.  Vertebral arteries:The vertebral arteries originate from the subclavian arteries bilaterally. There is a focal high-grade stenosis on the right. The left vertebral artery is the dominant vessel. No other focal stenosis or vascular injury is present within the neck.  Skeleton: Multilevel degenerative changes are noted within the cervical spine. 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 Slight anterolisthesis is degenerative  at C4-5.  Other neck: The soft tissues the neck are otherwise unremarkable. There is mild heterogeneity of the thyroid without a dominant lesion. Small pleural effusions are worse on the right. Diffuse ground-glass attenuation of both lungs likely represents edema.  CTA HEAD  Anterior circulation: Atherosclerotic calcifications are present in the right cavernous internal carotid artery without significant stenosis. The left internal carotid artery is occluded with minimal opacification of the terminal left ICA. The right A1 and M1 segments are intact. The anterior communicating artery is intact. ACA branch vessels fill bilaterally. There is faint opacification of the left A1 segment. Minimal contrast is noted in the left M1 segment. The proximal inferior left MCA branches opacified. No significant collaterals are present over the left frontal and parietal  convexity.  Posterior circulation: The left vertebral artery is the dominant vessel. The right PICA --------- the the PICA origin is visualized and normal bilaterally. The vertebrobasilar junction is within normal limits. The basilar artery is small with some segmental irregularity. A fetal type right posterior cerebral artery is noted. There is no significant posterior communicating artery on the left. Segmental attenuation of PCA branch vessels is worse right than left.  Venous sinuses: The dural sinuses are patent. Straight sinus and deep cerebral veins are patent. Right-sided cortical veins are intact. The cortical veins are not visualized over the infarcted territory.  Anatomic variants: Fetal type right posterior cerebral artery.  Delayed phase: Faint enhancement is present throughout the infarcted cortex compatible with luxury perfusion in the infarcted core.  IMPRESSION: 1. Large left MCA territory infarct. 2. The left common carotid artery is occluded 2.5 cm above the arch. 3. The very distal left ICA terminus is opacified without contrast in the cavernous segment. 4. Dense left MCA occlusion without significant collaterals. 5. Atherosclerotic calcifications at the right carotid bifurcation and cavernous internal carotid artery without significant stenosis. 6. High-grade stenosis of the non dominant proximal right vertebral artery. 7. Moderate distal small vessel disease within MCA branches on the right, bilateral ACA branches, and posterior cerebral arteries, worse on the right. 8. 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 9. Bilateral pleural effusions, right greater than left. 10. Pulmonary edema is also worse on the right. These results were called by telephone at the time of interpretation on 26-Jul-2015 at 3:02pm to Dr. Wyatt Portela , who verbally acknowledged these results.   Electronically Signed   By: Marin Roberts M.D.   On:  2015/07/26 15:35   Ct Angio Neck W/cm &/or Wo/cm  07-26-15   CLINICAL DATA:  Flaccid paralysis on the right. Code stroke. Patient is unresponsive.  EXAM: CT ANGIOGRAPHY HEAD AND NECK  TECHNIQUE: Multidetector CT imaging of the head and neck was performed using the standard protocol during bolus administration of intravenous contrast. Multiplanar CT image reconstructions and MIPs were obtained to evaluate the vascular anatomy. Carotid stenosis measurements (when applicable) are obtained utilizing NASCET criteria, using the distal internal carotid diameter as the denominator.  CONTRAST:  90mL OMNIPAQUE IOHEXOL 350 MG/ML SOLN  COMPARISON:  CT head without contrast from the same day. Carotid Doppler study from the same day.  FINDINGS: CT HEAD  Brain: A large left MCA territory infarct is no more evident. This involves the posterior left lentiform nucleus, insular cortex, and extensive cortex over the convexity. The left ACA and PCA territories are spared. Extensive periventricular and subcortical white matter hypoattenuation is additionally noted.  Calvarium and skull base: Within normal limits  Paranasal sinuses: Clear  Orbits: Bilateral lens replacements are noted. The globes and orbits are otherwise intact.  CTA NECK  Aortic arch: A 3 vessel arch configuration is present. Calcifications are present at the origin of the left subclavian artery without significant stenosis.  Right carotid system: Atherosclerotic changes are noted at the origin of the right common carotid artery from the innominate artery with less than 50% stenosis. The lumen is narrowed to 4 mm.  The remainder the right common carotid artery is within normal limits. Minimal calcifications are present at the carotid bifurcation. There is mild tortuosity of the cervical right ICA without significant focal stenosis.  Left carotid system: The left internal carotid artery is occluded 2.5 cm from its origin. There is no significant reconstitution in the  neck. There is some opacification of the left ECA branch vessels distally.  Vertebral arteries:The vertebral arteries originate from the subclavian arteries bilaterally. There is a focal high-grade stenosis on the right. The left vertebral artery is the dominant vessel. No other focal stenosis or vascular injury is present within the neck.  Skeleton: Multilevel degenerative changes are noted within the cervical spine. 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 Slight anterolisthesis is degenerative at C4-5.  Other neck: The soft tissues the neck are otherwise unremarkable. There is mild heterogeneity of the thyroid without a dominant lesion. Small pleural effusions are worse on the right. Diffuse ground-glass attenuation of both lungs likely represents edema.  CTA HEAD  Anterior circulation: Atherosclerotic calcifications are present in the right cavernous internal carotid artery without significant stenosis. The left internal carotid artery is occluded with minimal opacification of the terminal left ICA. The right A1 and M1 segments are intact. The anterior communicating artery is intact. ACA branch vessels fill bilaterally. There is faint opacification of the left A1 segment. Minimal contrast is noted in the left M1 segment. The proximal inferior left MCA branches opacified. No significant collaterals are present over the left frontal and parietal convexity.  Posterior circulation: The left vertebral artery is the dominant vessel. The right PICA ------- the the PICA origin is visualized and normal bilaterally. The vertebrobasilar junction is within normal limits. The basilar artery is small with some segmental irregularity. A fetal type right posterior cerebral artery is noted. There is no significant posterior communicating artery on the left. Segmental attenuation of PCA branch vessels is worse right than left.  Venous sinuses: The dural sinuses are patent. Straight sinus and  deep cerebral veins are patent. Right-sided cortical veins are intact. The cortical veins are not visualized over the infarcted territory.  Anatomic variants: Fetal type right posterior cerebral artery.  Delayed phase: Faint enhancement is present throughout the infarcted cortex compatible with luxury perfusion in the infarcted core.  IMPRESSION: 1. Large left MCA territory infarct. 2. The left common carotid artery is occluded 2.5 cm above the arch. 3. The very distal left ICA terminus is opacified without contrast in the cavernous segment. 4. Dense left MCA occlusion without significant collaterals. 5. Atherosclerotic calcifications at the right carotid bifurcation and cavernous internal carotid artery without significant stenosis. 6. High-grade stenosis of the non dominant proximal right vertebral artery. 7. Moderate distal small vessel disease within MCA branches on the right, bilateral ACA branches, and posterior cerebral arteries, worse on the right. 8. 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 9. Bilateral pleural effusions, right greater than left. 10. Pulmonary edema is also worse on the right.  These results were called by telephone at the time of interpretation on August 04, 2015 at 3:02pm to Dr. Wyatt Portela , who verbally acknowledged these results.   Electronically Signed   By: Marin Roberts M.D.   On: 08/04/2015 15:35   Ct Cerebral Perfusion W/cm  2015-08-04   CLINICAL DATA:  Unresponsive. Right-sided weakness. Last seen normal 20 hours ago.  EXAM: CT CEREBRAL PERFUSION WITH CONTRAST  TECHNIQUE: Dynamic contrast infusion was performed followed by complication of cerebral perfusion maps.  CONTRAST:  90mL OMNIPAQUE IOHEXOL 350 MG/ML SOLN  COMPARISON:  CT head without contrast from the same day.  FINDINGS: Mean time to transit and cerebral blood flow is diminished throughout the left MCA territory with sparing of the left caudate nucleus  and anterior left lentiform nucleus. ACA and MCA territories are preserved.  There is significant decreased cerebral blood volume throughout majority of the left MCA territory with sparing of the left temporal lobe. This involves approximately 2/3 of the left MCA territory.  IMPRESSION: 1. Large core infarct involving the majority of the left MCA territory. A smaller penumbra is seen along the left temporal lobe. 2. The infarct spares the left caudate head and anterior lentiform nucleus. These results were called by telephone at the time of interpretation on 08-04-15 at 3:03 pm to Dr. Wyatt Portela , who verbally acknowledged these results.   Electronically Signed   By: Marin Roberts M.D.   On: August 04, 2015 15:18   EKG: Independently reviewed.   Assessment/Plan Principal Problem:   Stroke Active Problems:   Hypertension    CVA - fairly large left MCA territory involvement per CTA - patient able to protect airway currently, will admit in stepdown for close monitoring, low threshold for ICU if her clinical condition deteriorates - neuro-checks Q2 - I have discussed with Dr. Cyril Mourning from neurology as well - MRI/echo/lipid panel/A1C - PT/OT/SLP, NPO for now - Aspirin pr - no need for carotids, CTA neck done  HTN - allow permissive HTN  Goals of care - Have discussed extensively with patient's daughter that her condition is quite severe, addressed code status in detail, she wishes for Full Code for now. She is hopeful at this time for the next 1-2 days but if her condition deteriorates further and her chances of recovery are grim, she is open to re-address.    Diet: NPO Fluids: none DVT Prophylaxis: heparin  Code Status: Full  Family Communication: d/w daughter bedside  Disposition Plan: admit to SDU  Time spent: 53, more than 50% bedside   Tyreonna Czaplicki M. Elvera Lennox, MD [facility] Pager [phone]  If 7PM-7AM, please contact night-coverage [email or web address] Password  [email or web address] August 04, 2015, 4:16 PM

## 2015-07-17 NOTE — ED Notes (Signed)
Discontinued upon arrival to this ED-D5 2o mEq K+ infusing at 75 mL/hr from [facility]

## 2015-07-17 NOTE — ED Provider Notes (Signed)
CSN: [id number]     Arrival date & time Aug 02, 2015  1414 History   First MD Initiated Contact with Patient 2015/08/02 1421     No chief complaint on file.    (Consider location/radiation/quality/duration/timing/severity/associated sxs/prior Treatment) HPI Comments: Seen at [facility] for a TIA last night. She returned to baseline. This morning daughter heard her fall. She had R arm flaccid paralysis and aphasia at that time. She was taken to the ED, where arrangements were made for a transfer here to [facility] for Code Stroke.  Patient is a 80 y.o. female presenting with neurologic complaint. The history is provided by the patient.  Neurologic Problem This is a recurrent problem. The current episode started 6 to 12 hours ago. The problem occurs constantly. The problem has not changed since onset.Pertinent negatives include no shortness of breath. Nothing aggravates the symptoms. Nothing relieves the symptoms.    History reviewed. No pertinent past medical history. No past surgical history on file. No family history on file. History  Substance Use Topics  . Smoking status: Not on file  . Smokeless tobacco: Not on file  . Alcohol Use: Not on file   OB History    No data available     Review of Systems  Unable to perform ROS: Acuity of condition  Constitutional: Negative for fever.  Respiratory: Negative for shortness of breath.       Allergies  Review of patient's allergies indicates no known allergies.  Home Medications   Prior to Admission medications   Medication Sig Start Date End Date Taking? Authorizing Provider  aspirin EC 81 MG tablet Take 81 mg by mouth daily.      Historical Provider, MD  carisoprodol (SOMA) 350 MG tablet Take 350 mg by mouth 2 (two) times daily as needed. For muscle spasms     Historical Provider, MD  esomeprazole (NEXIUM) 40 MG capsule Take 40 mg by mouth daily before breakfast.      Historical Provider, MD  estradiol (ESTRACE) 2 MG tablet  Take 2 mg by mouth daily.      Historical Provider, MD  hydroxypropyl methylcellulose (ISOPTO TEARS) 2.5 % ophthalmic solution Place 1 drop into both eyes 4 (four) times daily as needed. For dry eyes     Historical Provider, MD  oxyCODONE (OXYCONTIN) 40 MG 12 hr tablet Take 40 mg by mouth every 12 (twelve) hours.      Historical Provider, MD  sertraline (ZOLOFT) 100 MG tablet Take 100 mg by mouth daily.      Historical Provider, MD   There were no vitals taken for this visit. Physical Exam  Constitutional: She is oriented to person, place, and time. She appears well-developed and well-nourished. No distress.  HENT:  Head: Normocephalic and atraumatic.  Mouth/Throat: Oropharynx is clear and moist.  Eyes: EOM are normal. Pupils are equal, round, and reactive to light.  Neck: Normal range of motion. Neck supple.  Cardiovascular: Normal rate and regular rhythm.  Exam reveals no friction rub.   No murmur heard. Pulmonary/Chest: Effort normal and breath sounds normal. No respiratory distress. She has no wheezes. She has no rales.  Abdominal: Soft. She exhibits no distension. There is no tenderness. There is no rebound.  Musculoskeletal: Normal range of motion. She exhibits no edema.  Neurological: She is alert and oriented to person, place, and time. A sensory deficit is present. She exhibits abnormal muscle tone (R arm flaccid paralysis). GCS eye subscore is 4. GCS verbal subscore is 1. GCS  motor subscore is 6.  Skin: She is not diaphoretic.  Nursing note and vitals reviewed.   ED Course  Procedures (including critical care time) Labs Review Labs Reviewed - No data to display  Imaging Review Ct Angio Head W/cm &/or Wo Cm  06/25/2015   CLINICAL DATA:  Flaccid paralysis on the right. Code stroke. Patient is unresponsive.  EXAM: CT ANGIOGRAPHY HEAD AND NECK  TECHNIQUE: Multidetector CT imaging of the head and neck was performed using the standard protocol during bolus administration of  intravenous contrast. Multiplanar CT image reconstructions and MIPs were obtained to evaluate the vascular anatomy. Carotid stenosis measurements (when applicable) are obtained utilizing NASCET criteria, using the distal internal carotid diameter as the denominator.  CONTRAST:  90mL OMNIPAQUE IOHEXOL 350 MG/ML SOLN  COMPARISON:  CT head without contrast from the same day. Carotid Doppler study from the same day.  FINDINGS: CT HEAD  Brain: A large left MCA territory infarct is no more evident. This involves the posterior left lentiform nucleus, insular cortex, and extensive cortex over the convexity. The left ACA and PCA territories are spared. Extensive periventricular and subcortical white matter hypoattenuation is additionally noted.  Calvarium and skull base: Within normal limits  Paranasal sinuses: Clear  Orbits: Bilateral lens replacements are noted. The globes and orbits are otherwise intact.  CTA NECK  Aortic arch: A 3 vessel arch configuration is present. Calcifications are present at the origin of the left subclavian artery without significant stenosis.  Right carotid system: Atherosclerotic changes are noted at the origin of the right common carotid artery from the innominate artery with less than 50% stenosis. The lumen is narrowed to 4 mm.  The remainder the right common carotid artery is within normal limits. Minimal calcifications are present at the carotid bifurcation. There is mild tortuosity of the cervical right ICA without significant focal stenosis.  Left carotid system: The left internal carotid artery is occluded 2.5 cm from its origin. There is no significant reconstitution in the neck. There is some opacification of the left ECA branch vessels distally.  Vertebral arteries:The vertebral arteries originate from the subclavian arteries bilaterally. There is a focal high-grade stenosis on the right. The left vertebral artery is the dominant vessel. No other focal stenosis or vascular injury is  present within the neck.  Skeleton: Multilevel degenerative changes are noted within the cervical spine. 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 Slight anterolisthesis is degenerative at C4-5.  Other neck: The soft tissues the neck are otherwise unremarkable. There is mild heterogeneity of the thyroid without a dominant lesion. Small pleural effusions are worse on the right. Diffuse ground-glass attenuation of both lungs likely represents edema.  CTA HEAD  Anterior circulation: Atherosclerotic calcifications are present in the right cavernous internal carotid artery without significant stenosis. The left internal carotid artery is occluded with minimal opacification of the terminal left ICA. The right A1 and M1 segments are intact. The anterior communicating artery is intact. ACA branch vessels fill bilaterally. There is faint opacification of the left A1 segment. Minimal contrast is noted in the left M1 segment. The proximal inferior left MCA branches opacified. No significant collaterals are present over the left frontal and parietal convexity.  Posterior circulation: The left vertebral artery is the dominant vessel. The right PICA **---** the the PICA origin is visualized and normal bilaterally. The vertebrobasilar junction is within normal limits. The basilar artery is small with some segmental irregularity. A fetal type right posterior cerebral artery  is noted. There is no significant posterior communicating artery on the left. Segmental attenuation of PCA branch vessels is worse right than left.  Venous sinuses: The dural sinuses are patent. Straight sinus and deep cerebral veins are patent. Right-sided cortical veins are intact. The cortical veins are not visualized over the infarcted territory.  Anatomic variants: Fetal type right posterior cerebral artery.  Delayed phase: Faint enhancement is present throughout the infarcted cortex compatible with luxury perfusion in the  infarcted core.  IMPRESSION: 1. Large left MCA territory infarct. 2. The left common carotid artery is occluded 2.5 cm above the arch. 3. The very distal left ICA terminus is opacified without contrast in the cavernous segment. 4. Dense left MCA occlusion without significant collaterals. 5. Atherosclerotic calcifications at the right carotid bifurcation and cavernous internal carotid artery without significant stenosis. 6. High-grade stenosis of the non dominant proximal right vertebral artery. 7. Moderate distal small vessel disease within MCA branches on the right, bilateral ACA branches, and posterior cerebral arteries, worse on the right. 8. 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 9. Bilateral pleural effusions, right greater than left. 10. Pulmonary edema is also worse on the right. These results were called by telephone at the time of interpretation on 06/25/2015 at 3:02pm to Dr. Wyatt Portela , who verbally acknowledged these results.   Electronically Signed   By: Marin Roberts M.D.   On: 07/06/2015 15:35   Ct Angio Neck W/cm &/or Wo/cm  06/18/2015   CLINICAL DATA:  Flaccid paralysis on the right. Code stroke. Patient is unresponsive.  EXAM: CT ANGIOGRAPHY HEAD AND NECK  TECHNIQUE: Multidetector CT imaging of the head and neck was performed using the standard protocol during bolus administration of intravenous contrast. Multiplanar CT image reconstructions and MIPs were obtained to evaluate the vascular anatomy. Carotid stenosis measurements (when applicable) are obtained utilizing NASCET criteria, using the distal internal carotid diameter as the denominator.  CONTRAST:  90mL OMNIPAQUE IOHEXOL 350 MG/ML SOLN  COMPARISON:  CT head without contrast from the same day. Carotid Doppler study from the same day.  FINDINGS: CT HEAD  Brain: A large left MCA territory infarct is no more evident. This involves the posterior left lentiform nucleus,  insular cortex, and extensive cortex over the convexity. The left ACA and PCA territories are spared. Extensive periventricular and subcortical white matter hypoattenuation is additionally noted.  Calvarium and skull base: Within normal limits  Paranasal sinuses: Clear  Orbits: Bilateral lens replacements are noted. The globes and orbits are otherwise intact.  CTA NECK  Aortic arch: A 3 vessel arch configuration is present. Calcifications are present at the origin of the left subclavian artery without significant stenosis.  Right carotid system: Atherosclerotic changes are noted at the origin of the right common carotid artery from the innominate artery with less than 50% stenosis. The lumen is narrowed to 4 mm.  The remainder the right common carotid artery is within normal limits. Minimal calcifications are present at the carotid bifurcation. There is mild tortuosity of the cervical right ICA without significant focal stenosis.  Left carotid system: The left internal carotid artery is occluded 2.5 cm from its origin. There is no significant reconstitution in the neck. There is some opacification of the left ECA branch vessels distally.  Vertebral arteries:The vertebral arteries originate from the subclavian arteries bilaterally. There is a focal high-grade stenosis on the right. The left vertebral artery is the dominant vessel. No other focal stenosis or  vascular injury is present within the neck.  Skeleton: Multilevel degenerative changes are noted within the cervical spine. 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 Slight anterolisthesis is degenerative at C4-5.  Other neck: The soft tissues the neck are otherwise unremarkable. There is mild heterogeneity of the thyroid without a dominant lesion. Small pleural effusions are worse on the right. Diffuse ground-glass attenuation of both lungs likely represents edema.  CTA HEAD  Anterior circulation: Atherosclerotic calcifications  are present in the right cavernous internal carotid artery without significant stenosis. The left internal carotid artery is occluded with minimal opacification of the terminal left ICA. The right A1 and M1 segments are intact. The anterior communicating artery is intact. ACA branch vessels fill bilaterally. There is faint opacification of the left A1 segment. Minimal contrast is noted in the left M1 segment. The proximal inferior left MCA branches opacified. No significant collaterals are present over the left frontal and parietal convexity.  Posterior circulation: The left vertebral artery is the dominant vessel. The right PICA **---** the the PICA origin is visualized and normal bilaterally. The vertebrobasilar junction is within normal limits. The basilar artery is small with some segmental irregularity. A fetal type right posterior cerebral artery is noted. There is no significant posterior communicating artery on the left. Segmental attenuation of PCA branch vessels is worse right than left.  Venous sinuses: The dural sinuses are patent. Straight sinus and deep cerebral veins are patent. Right-sided cortical veins are intact. The cortical veins are not visualized over the infarcted territory.  Anatomic variants: Fetal type right posterior cerebral artery.  Delayed phase: Faint enhancement is present throughout the infarcted cortex compatible with luxury perfusion in the infarcted core.  IMPRESSION: 1. Large left MCA territory infarct. 2. The left common carotid artery is occluded 2.5 cm above the arch. 3. The very distal left ICA terminus is opacified without contrast in the cavernous segment. 4. Dense left MCA occlusion without significant collaterals. 5. Atherosclerotic calcifications at the right carotid bifurcation and cavernous internal carotid artery without significant stenosis. 6. High-grade stenosis of the non dominant proximal right vertebral artery. 7. Moderate distal small vessel disease within MCA  branches on the right, bilateral ACA branches, and posterior cerebral arteries, worse on the right. 8. 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 9. Bilateral pleural effusions, right greater than left. 10. Pulmonary edema is also worse on the right. These results were called by telephone at the time of interpretation on 07/11/2015 at 3:02pm to Dr. Wyatt Portela , who verbally acknowledged these results.   Electronically Signed   By: Marin Roberts M.D.   On: 07/08/2015 15:35   Ct Cerebral Perfusion W/cm  06/18/2015   CLINICAL DATA:  Unresponsive. Right-sided weakness. Last seen normal 20 hours ago.  EXAM: CT CEREBRAL PERFUSION WITH CONTRAST  TECHNIQUE: Dynamic contrast infusion was performed followed by complication of cerebral perfusion maps.  CONTRAST:  90mL OMNIPAQUE IOHEXOL 350 MG/ML SOLN  COMPARISON:  CT head without contrast from the same day.  FINDINGS: Mean time to transit and cerebral blood flow is diminished throughout the left MCA territory with sparing of the left caudate nucleus and anterior left lentiform nucleus. ACA and MCA territories are preserved.  There is significant decreased cerebral blood volume throughout majority of the left MCA territory with sparing of the left temporal lobe. This involves approximately 2/3 of the left MCA territory.  IMPRESSION: 1. Large core infarct involving the  majority of the left MCA territory. A smaller penumbra is seen along the left temporal lobe. 2. The infarct spares the left caudate head and anterior lentiform nucleus. These results were called by telephone at the time of interpretation on 01-Aug-2015 at 3:03 pm to Dr. Wyatt Portela , who verbally acknowledged these results.   Electronically Signed   By: Marin Roberts M.D.   On: 2015/08/01 15:18     EKG Interpretation None      MDM   Final diagnoses:  Acute ischemic left MCA stroke    Patient here as a Code Stroke from  [facility] Last seen normal last night. Patient's daughter brought her to the ED at [facility] where she was found to have aphasia and R arm flaccid paralysis. She was seen in the ED the night prior with a TIA. She was found to have a clot in the L carotid. The hospital there spoke with Dr. Leroy Kennedy of Neurology here who recommended transfer as a Code Stroke. They also spoke with interventional radiology for possible intervention.   Neurology present upon arrival. CT Perfusion study ordered. NIH scale is 25.  Admitted to the hospital for further monitoring.  Elwin Mocha, MD 01-Aug-2015 [phone]

## 2015-07-17 NOTE — H&P (Signed)
PULMONARY / CRITICAL CARE MEDICINE HISTORY AND PHYSICAL EXAMINATION   Name: Charlene Ellis MRN: [id number] DOB: 10-Nov-1935    ADMISSION DATE:  06/20/2015  PRIMARY SERVICE: PCCM  CHIEF COMPLAINT:  Nonverbal  BRIEF PATIENT DESCRIPTION: 80 y/o woman with large L MCA stroke with late presentation  SIGNIFICANT EVENTS / STUDIES:  MR Brain 7/30: Large left MCA acute nonhemorrhagic infarct with mass effect and 4 mm midline shift.  LINES / TUBES: - PIV - Foley 7/30 - L [place] CVC 7/30  CULTURES: MRSA screen (+)  ANTIBIOTICS: None  HISTORY OF PRESENT ILLNESS:  See H&P from today by Dr. Elvera Lennox for complete history, but briefly, Charlene Ellis is a 80 y/o woman who presented yesterday to OSH w/ complaints of R sided weakness and diagnosed with TIA and sent home. This morning her daughter found her non-verbal and completely limp on right side. She was found to have a large L MCA stroke and was transferred to [facility]. Given the MRI findings of midline shift, neurology reccomended 3% saline, so she was trasnferred to the ICU for further mangement.  PAST MEDICAL HISTORY :  Arthritis HTN "hip arthritis"  Prior to Admission medications   Medication Sig Start Date End Date Taking? Authorizing Provider  aspirin EC 325 MG tablet Take 325 mg by mouth daily.   Yes Historical Provider, MD  estradiol (ESTRACE) 2 MG tablet Take 2 mg by mouth daily.     Yes Historical Provider, MD  Fe Fum-Fe Poly-Vit C-Lactobac (FUSION) 65-65-25-30 MG CAPS Take 1 capsule by mouth daily.   Yes Historical Provider, MD  furosemide (LASIX) 20 MG tablet Take 20 mg by mouth daily.   Yes Historical Provider, MD  hydroxypropyl methylcellulose (ISOPTO TEARS) 2.5 % ophthalmic solution Place 1 drop into both eyes 4 (four) times daily as needed. For dry eyes     Historical Provider, MD  losartan (COZAAR) 100 MG tablet Take 100 mg by mouth daily.   Yes Historical Provider, MD  lovastatin (MEVACOR) 10 MG tablet Take 10 mg by mouth  at bedtime.   Yes Historical Provider, MD  omeprazole (PRILOSEC) 20 MG capsule Take 20 mg by mouth daily.   Yes Historical Provider, MD  oxyCODONE (ROXICODONE) 15 MG immediate release tablet Take 15 mg by mouth every 4 (four) hours as needed for pain.   Yes Historical Provider, MD  potassium chloride SA (K-DUR,KLOR-CON) 20 MEQ tablet Take 20 mEq by mouth daily.   Yes Historical Provider, MD  promethazine (PHENERGAN) 25 MG tablet Take 25 mg by mouth 2 (two) times daily.   Yes Historical Provider, MD  ranitidine (ZANTAC) 150 MG tablet Take 150 mg by mouth 2 (two) times daily.   Yes Historical Provider, MD  senna (SENOKOT) 8.6 MG tablet Take 1 tablet by mouth daily.   Yes Historical Provider, MD  sertraline (ZOLOFT) 100 MG tablet Take 100 mg by mouth 2 (two) times daily.    Yes Historical Provider, MD  temazepam (RESTORIL) 15 MG capsule Take 15 mg by mouth at bedtime.   Yes Historical Provider, MD  topiramate (TOPAMAX) 50 MG tablet Take 50 mg by mouth 2 (two) times daily.   Yes Historical Provider, MD  Vitamin D, Ergocalciferol, (DRISDOL) 50000 UNITS CAPS capsule Take 50,000 Units by mouth every 7 (seven) days. Take on Mondays   Yes Historical Provider, MD   No Known Allergies  FAMILY HISTORY:  Cannot obtain 2/2 AMS. No family history on file.  SOCIAL HISTORY: per records no reported  tobacco, alcohol or drugs  REVIEW OF SYSTEMS:  Cannot assess.  SUBJECTIVE:   VITAL SIGNS: Temp:  [98 F (36.7 C)-98.5 F (36.9 C)] 98.5 F (36.9 C) (07/30 2000) Pulse Rate:  [56-95] 56 (07/30 2000) Resp:  [17-25] 17 (07/30 2000) BP: (126-180)/(56-98) 149/56 mmHg (07/30 2000) SpO2:  [88 %-99 %] 95 % (07/30 2000) Weight:  [149 lb 4 oz (67.7 kg)] 149 lb 4 oz (67.7 kg) (07/30 1834) HEMODYNAMICS:   VENTILATOR SETTINGS:   INTAKE / OUTPUT: Intake/Output      07/30 0701 - 07/31 0700       Urine Occurrence 1 x   Stool Occurrence 1 x     PHYSICAL EXAMINATION: General:  Elderly woman, awake, but  non-responsive. Neuro:  Leftward deviated gaze, will track in left visual field, unable to follow commands. No motor movement on right. Moves right extremities. HEENT:  MMM Neck: No masses. Cardiovascular:  Heart sounds dual and normal. Lungs:  CTAB Abdomen: Soft Musculoskeletal: No deformities. Skin:  No rashes.  LABS:  CBC  Recent Labs Lab 08-13-2015 1501  WBC 9.8  HGB 11.7*  HCT 34.5*  PLT PLATELET CLUMPS NOTED ON SMEAR, COUNT APPEARS DECREASED   Coag's  Recent Labs Lab 13-Aug-2015 1501  INR 1.12   BMET  Recent Labs Lab Aug 13, 2015 1501 August 13, 2015 1904  NA 138 141  K 3.7  --   CL 109  --   CO2 19*  --   BUN 15  --   CREATININE 0.87  --   GLUCOSE 146*  --    Electrolytes  Recent Labs Lab 08/13/2015 1501  CALCIUM 9.0   Sepsis Markers  Recent Labs Lab August 13, 2015 1510  LATICACIDVEN 1.58   ABG No results for input(s): PHART, PCO2ART, PO2ART in the last 168 hours. Liver Enzymes No results for input(s): AST, ALT, ALKPHOS, BILITOT, ALBUMIN in the last 168 hours. Cardiac Enzymes No results for input(s): TROPONINI, PROBNP in the last 168 hours. Glucose  Recent Labs Lab 13-Aug-2015 1502  GLUCAP 131*    Imaging   EKG: NSR CXR: CXR from today extremely limited due to positioning and breast implants, but from 7/29 shows some RUL infiltrate.  ASSESSMENT / PLAN:  Principal Problem:   Stroke Active Problems:   Hypertension   PULMONARY A: Possible aspiration Poor airway protection P:   - Patient is DNI, but not presently hypoxic. Would be poor BiPAP candidate if develops hypercarbia. Will persue conservative management only.  CARDIOVASCULAR A: - HTN P:   Permissive HTN given (sub-)acute stroke  RENAL A: Theraputic hypernatremia P:   - will start 3% hypertonic saline for developing cerebral edema. Follow protocols for sodium management.  GASTROINTESTINAL A: Possible aspiration P:   Maintain NPO status. Consider NGT placement tomorrow once  trajectory is a little more clear.  HEMATOLOGIC A: Acute Stroke P:   Discussed importance of antiplatelets (ordered) with bedside RN. Will need to give PR.  INFECTIOUS A: No active issues. P:    ENDOCRINE A: No active issues. P:    NEUROLOGIC A: Acute L MCA stroke Cerebral edema with midline shift P:   - Permissive HTN - 3% saline - ASA - Appreciate neuro recs  BEST PRACTICE / DISPOSITION Level of Care:  ICU Primary Service:  PCCM Consultants:  Neurology Code Status:  DNR/DNI Diet:  NPO DVT Px:  Heparin GI Px:  None Skin Integrity:  Intact Social / Family:  Updated today  TODAY'S SUMMARY: 80 y/o woman with large L MCA stroke, now  DNR/DNI. Admitted to ICU for 3% saline.  I have personally obtained a history, examined the patient, evaluated laboratory and imaging results, formulated the assessment and plan and placed orders.  CRITICAL CARE: The patient is critically ill with multiple organ systems failure and requires high complexity decision making for assessment and support, frequent evaluation and titration of therapies, application of advanced monitoring technologies and extensive interpretation of multiple databases. Critical Care Time devoted to patient care services described in this note is 80 minutes.   Jamie Kato, MD Pulmonary and Critical Care Medicine [facility] Pager: [phone]   08-12-2015, 9:40 PM

## 2015-07-18 ENCOUNTER — Encounter (HOSPITAL_COMMUNITY): Payer: Self-pay | Admitting: *Deleted

## 2015-07-18 DIAGNOSIS — I158 Other secondary hypertension: Secondary | ICD-10-CM

## 2015-07-18 DIAGNOSIS — I63512 Cerebral infarction due to unspecified occlusion or stenosis of left middle cerebral artery: Secondary | ICD-10-CM

## 2015-07-18 DIAGNOSIS — I639 Cerebral infarction, unspecified: Secondary | ICD-10-CM

## 2015-07-18 DIAGNOSIS — R1310 Dysphagia, unspecified: Secondary | ICD-10-CM

## 2015-07-18 LAB — COMPREHENSIVE METABOLIC PANEL
ALK PHOS: 79 U/L (ref 38–126)
ALT: 12 U/L — AB (ref 14–54)
AST: 18 U/L (ref 15–41)
Albumin: 3 g/dL — ABNORMAL LOW (ref 3.5–5.0)
Anion gap: 6 (ref 5–15)
BILIRUBIN TOTAL: 0.8 mg/dL (ref 0.3–1.2)
BUN: 13 mg/dL (ref 6–20)
CHLORIDE: 116 mmol/L — AB (ref 101–111)
CO2: 24 mmol/L (ref 22–32)
Calcium: 8.9 mg/dL (ref 8.9–10.3)
Creatinine, Ser: 0.82 mg/dL (ref 0.44–1.00)
GFR calc Af Amer: >60 mL/min (ref >60)
Glucose, Bld: 138 mg/dL — ABNORMAL HIGH (ref 65–99)
POTASSIUM: 3.2 mmol/L — AB (ref 3.5–5.1)
SODIUM: 146 mmol/L — AB (ref 135–145)
TOTAL PROTEIN: 5.7 g/dL — AB (ref 6.5–8.1)

## 2015-07-18 LAB — CK TOTAL AND CKMB (NOT AT ARMC)
CK, MB: 2.2 ng/mL (ref 0.5–5.0)
Relative Index: 1.9 (ref 0.0–2.5)
Total CK: 113 U/L (ref 38–234)

## 2015-07-18 LAB — TROPONIN I
TROPONIN I: 0.14 ng/mL — AB (ref <0.031)
TROPONIN I: 0.27 ng/mL — AB (ref <0.031)
Troponin I: 0.34 ng/mL — ABNORMAL HIGH (ref <0.031)

## 2015-07-18 LAB — CBC
HCT: 30.3 % — ABNORMAL LOW (ref 36.0–46.0)
HEMOGLOBIN: 10.2 g/dL — AB (ref 12.0–15.0)
MCH: 29.1 pg (ref 26.0–34.0)
MCHC: 33.7 g/dL (ref 30.0–36.0)
MCV: 86.3 fL (ref 78.0–100.0)
RBC: 3.51 MIL/uL — ABNORMAL LOW (ref 3.87–5.11)
RDW: 13.9 % (ref 11.5–15.5)
WBC: 10.2 10*3/uL (ref 4.0–10.5)

## 2015-07-18 LAB — SODIUM
Sodium: 147 mmol/L — ABNORMAL HIGH (ref 135–145)
Sodium: 149 mmol/L — ABNORMAL HIGH (ref 135–145)
Sodium: 150 mmol/L — ABNORMAL HIGH (ref 135–145)
Sodium: 153 mmol/L — ABNORMAL HIGH (ref 135–145)

## 2015-07-18 LAB — GLUCOSE, CAPILLARY
Glucose-Capillary: 162 mg/dL — ABNORMAL HIGH (ref 65–99)
Glucose-Capillary: 150 mg/dL — ABNORMAL HIGH (ref 65–99)
Glucose-Capillary: 139 mg/dL — ABNORMAL HIGH (ref 65–99)

## 2015-07-18 LAB — LIPID PANEL
CHOL/HDL RATIO: 3.9 ratio
Cholesterol: 264 mg/dL — ABNORMAL HIGH (ref 0–200)
HDL: 67 mg/dL (ref >40)
LDL Cholesterol: 181 mg/dL — ABNORMAL HIGH (ref 0–99)
Triglycerides: 79 mg/dL (ref <150)
VLDL: 16 mg/dL (ref 0–40)

## 2015-07-18 MED ORDER — CETYLPYRIDINIUM CHLORIDE 0.05 % MT LIQD
7.0000 mL | Freq: Two times a day (BID) | OROMUCOSAL | Status: DC
  Administered 2015-07-18 – 2015-07-22 (×8): 7 mL via OROMUCOSAL

## 2015-07-18 MED ORDER — PNEUMOCOCCAL VAC POLYVALENT 25 MCG/0.5ML IJ INJ
0.5000 mL | INJECTION | INTRAMUSCULAR | Status: AC
  Administered 2015-07-19: 0.5 mL via INTRAMUSCULAR
  Filled 2015-07-18: qty 0.5

## 2015-07-18 MED ORDER — FUROSEMIDE 10 MG/ML IJ SOLN
10.0000 mg | Freq: Once | INTRAMUSCULAR | Status: AC
  Administered 2015-07-18: 10 mg via INTRAVENOUS
  Filled 2015-07-18: qty 2

## 2015-07-18 MED ORDER — LABETALOL HCL 5 MG/ML IV SOLN
10.0000 mg | INTRAVENOUS | Status: DC | PRN
  Administered 2015-07-19: 10 mg via INTRAVENOUS
  Filled 2015-07-18: qty 4

## 2015-07-18 MED ORDER — HYDRALAZINE HCL 20 MG/ML IJ SOLN
10.0000 mg | INTRAMUSCULAR | Status: DC | PRN
  Administered 2015-07-18 – 2015-07-20 (×7): 20 mg via INTRAVENOUS
  Administered 2015-07-20: 30 mg via INTRAVENOUS
  Administered 2015-07-20 (×2): 20 mg via INTRAVENOUS
  Administered 2015-07-21: 40 mg via INTRAVENOUS
  Filled 2015-07-18: qty 1
  Filled 2015-07-18 (×2): qty 2
  Filled 2015-07-18 (×3): qty 1
  Filled 2015-07-18: qty 2
  Filled 2015-07-18 (×2): qty 1
  Filled 2015-07-18: qty 2
  Filled 2015-07-18: qty 1

## 2015-07-18 MED ORDER — LABETALOL HCL 5 MG/ML IV SOLN
10.0000 mg | INTRAVENOUS | Status: DC | PRN
  Administered 2015-07-18: 10 mg via INTRAVENOUS

## 2015-07-18 MED ORDER — METOPROLOL TARTRATE 25 MG PO TABS
25.0000 mg | ORAL_TABLET | Freq: Two times a day (BID) | ORAL | Status: DC
  Filled 2015-07-18: qty 1

## 2015-07-18 MED ORDER — POTASSIUM CHLORIDE 10 MEQ/50ML IV SOLN
10.0000 meq | INTRAVENOUS | Status: AC
  Administered 2015-07-18 (×4): 10 meq via INTRAVENOUS
  Filled 2015-07-18 (×4): qty 50

## 2015-07-18 MED ORDER — INSULIN ASPART 100 UNIT/ML ~~LOC~~ SOLN
0.0000 [IU] | SUBCUTANEOUS | Status: DC
  Administered 2015-07-18 (×2): 2 [IU] via SUBCUTANEOUS
  Administered 2015-07-18: 1 [IU] via SUBCUTANEOUS
  Administered 2015-07-19: 2 [IU] via SUBCUTANEOUS
  Administered 2015-07-19 (×5): 1 [IU] via SUBCUTANEOUS
  Administered 2015-07-20: 2 [IU] via SUBCUTANEOUS
  Administered 2015-07-20 (×2): 1 [IU] via SUBCUTANEOUS
  Administered 2015-07-20 (×2): 2 [IU] via SUBCUTANEOUS
  Administered 2015-07-20: 1 [IU] via SUBCUTANEOUS
  Administered 2015-07-21: 2 [IU] via SUBCUTANEOUS
  Administered 2015-07-21 (×2): 1 [IU] via SUBCUTANEOUS
  Administered 2015-07-21: 2 [IU] via SUBCUTANEOUS

## 2015-07-18 MED ORDER — LABETALOL HCL 5 MG/ML IV SOLN
INTRAVENOUS | Status: AC
  Filled 2015-07-18: qty 4

## 2015-07-18 MED ORDER — METOPROLOL TARTRATE 1 MG/ML IV SOLN
5.0000 mg | Freq: Two times a day (BID) | INTRAVENOUS | Status: DC
  Administered 2015-07-18 – 2015-07-20 (×4): 5 mg via INTRAVENOUS
  Filled 2015-07-18 (×6): qty 5

## 2015-07-18 MED ORDER — ATORVASTATIN CALCIUM 80 MG PO TABS
80.0000 mg | ORAL_TABLET | Freq: Every day | ORAL | Status: DC
  Administered 2015-07-19 – 2015-07-20 (×2): 80 mg
  Filled 2015-07-18 (×4): qty 1

## 2015-07-18 NOTE — Progress Notes (Signed)
[facility] ADULT ICU REPLACEMENT PROTOCOL FOR AM LAB REPLACEMENT ONLY  The patient does apply for the [facility] Adult ICU Electrolyte Replacment Protocol based on the criteria listed below:   1. Is GFR >/= 40 ml/min? Yes.    Patient's GFR today is >60 2. Is urine output >/= 0.5 ml/kg/hr for the last 6 hours? Yes.   Patient's UOP is 1.1 ml/kg/hr 3. Is BUN < 60 mg/dL? Yes.    Patient's BUN today is 13 4. Abnormal electrolyte(s): K 3.2 5. Ordered repletion with: per protocol 6. If a panic level lab has been reported, has the CCM MD in charge been notified? No..   Physician:    Markus Daft A 07/18/2015 6:18 AM

## 2015-07-18 NOTE — Evaluation (Signed)
Clinical/Bedside Swallow Evaluation Patient Details  Name: SHENIECE RUGGLES MRN: [id number] Date of Birth: [birth year]  Today's Date: 07/18/2015 Time: SLP Start Time (ACUTE ONLY): 0855 SLP Stop Time (ACUTE ONLY): 0906 SLP Time Calculation (min) (ACUTE ONLY): 11 min  Past Medical History: History reviewed. No pertinent past medical history. Past Surgical History: No past surgical history on file. HPI:  80 y/o woman who presented 7/29 to OSH w/ complaints of R sided weakness and was diagnosed with TIA and sent home. On 7/30 her daughter found her non-verbal and completely limp on right side. She was found to have a large L MCA stroke with 4 mm midline shift, and was transferred to [facility].    Assessment / Plan / Recommendation Clinical Impression  Pt is lethargic this morning and not sufficiently alert for PO trials. Mild increase in alertness noted after provision of oral care, however pt quickly closes her eyes to rest. Presentation of cold spoon and ice chip for thermotactile stimulation does not trigger any oral movements. SLP to follow for PO readiness. Will complete speech-language evaluation as pt becomes more alert.    Aspiration Risk  Severe    Diet Recommendation NPO   Medication Administration: Via alternative means    Other  Recommendations Oral Care Recommendations: Oral care QID   Follow Up Recommendations       Frequency and Duration min 3x week  1 week   Pertinent Vitals/Pain n/a    SLP Swallow Goals     Swallow Study Prior Functional Status       General Date of Onset: 07/16/15 Other Pertinent Information: 80 y/o woman who presented 7/29 to OSH w/ complaints of R sided weakness and was diagnosed with TIA and sent home. On 7/30 her daughter found her non-verbal and completely limp on right side. She was found to have a large L MCA stroke with 4 mm midline shift, and was transferred to [facility].  Type of Study: Bedside swallow  evaluation Previous Swallow Assessment: none in chart Diet Prior to this Study: NPO Temperature Spikes Noted: Yes (99.7) Respiratory Status: Room air History of Recent Intubation: No Behavior/Cognition: Lethargic/Drowsy Oral Cavity - Dentition: Adequate natural dentition/normal for age Patient Positioning: Upright in bed Baseline Vocal Quality: Other (comment) (UTA)    Oral/Motor/Sensory Function     Ice Chips Ice chips: Impaired Presentation: Spoon Oral Phase Impairments: Poor awareness of bolus;Other (comment) (no oral acceptance)   Thin Liquid Thin Liquid: Not tested    Nectar Thick Nectar Thick Liquid: Not tested   Honey Thick Honey Thick Liquid: Not tested   Puree Puree: Not tested   Solid   Solid: Not tested      Maxcine Ham, M.A. CCC-SLP [phone]  Maxcine Ham 07/18/2015,9:16 AM

## 2015-07-18 NOTE — Progress Notes (Signed)
PULMONARY / CRITICAL CARE MEDICINE HISTORY AND PHYSICAL EXAMINATION   Name: Charlene Ellis MRN: [id number] DOB: 1935-09-25    ADMISSION DATE:  07/15/2015  PRIMARY SERVICE: PCCM  CHIEF COMPLAINT:  Nonverbal  BRIEF PATIENT DESCRIPTION: 80 y/o woman with large L MCA stroke with late presentation  SIGNIFICANT EVENTS / STUDIES:  CTA brain 7/30 > dense left MCA occlusion, left common carotid artery occlusion CT brain perfusion scan 7/30 > large core infarct of majority of left MCA MR Brain 7/30: Large left MCA acute nonhemorrhagic infarct with mass effect and 4 mm midline shift.  LINES / TUBES: - PIV - Foley 7/30 - L Alvarado CVC 7/30  CULTURES: MRSA screen (+)  ANTIBIOTICS: None   SUBJECTIVE: admitted yesterday for large left MCA distribution stroke  VITAL SIGNS: Temp:  [98 F (36.7 C)-99.7 F (37.6 C)] 98.9 F (37.2 C) (07/31 0800) Pulse Rate:  [56-95] 57 (07/31 0900) Resp:  [16-25] 19 (07/31 0900) BP: (126-183)/(41-98) 166/60 mmHg (07/31 0900) SpO2:  [88 %-100 %] 97 % (07/31 0900) Weight:  [67.7 kg (149 lb 4 oz)] 67.7 kg (149 lb 4 oz) (07/30 1834) HEMODYNAMICS:   VENTILATOR SETTINGS:   INTAKE / OUTPUT: Intake/Output      07/30 0701 - 07/31 0700 07/31 0701 - 08/01 0700   I.V. (mL/kg) 600 (8.9) 100 (1.5)   IV Piggyback 50    Total Intake(mL/kg) 650 (9.6) 100 (1.5)   Urine (mL/kg/hr) 550    Total Output 550     Net +100 +100        Urine Occurrence 1 x    Stool Occurrence 1 x      PHYSICAL EXAMINATION:  Gen: lying in bed, no distress HENT: scar over left eye PULM: CTA B CV: RRR, no mgr GI: BS+, soft, nontender Ext: trace edema, SCD Neuro: flinch to pain R leg, no movement R arm, follows commands with prompting left arm/leg; non-verbal  LABS:  CBC  Recent Labs Lab 07/06/2015 1501 07/18/15 0515  WBC 9.8 10.2  HGB 11.7* 10.2*  HCT 34.5* 30.3*  PLT PLATELET CLUMPS NOTED ON SMEAR, COUNT APPEARS DECREASED PLATELET CLUMPING, SUGGEST RECOLLECTION OF SAMPLE IN  CITRATE TUBE.   Coag's  Recent Labs Lab 06/24/2015 1501  INR 1.12   BMET  Recent Labs Lab 07/02/2015 1501  06/27/2015 2130 07/18/15 0515 07/18/15 0615  NA 138  < > 141 146* 147*  K 3.7  --   --  3.2*  --   CL 109  --   --  116*  --   CO2 19*  --   --  24  --   BUN 15  --   --  13  --   CREATININE 0.87  --   --  0.82  --   GLUCOSE 146*  --   --  138*  --   < > = values in this interval not displayed. Electrolytes  Recent Labs Lab 07/11/2015 1501 07/18/15 0515  CALCIUM 9.0 8.9   Sepsis Markers  Recent Labs Lab 07/11/2015 1510  LATICACIDVEN 1.58   ABG No results for input(s): PHART, PCO2ART, PO2ART in the last 168 hours. Liver Enzymes  Recent Labs Lab 07/18/15 0515  AST 18  ALT 12*  ALKPHOS 79  BILITOT 0.8  ALBUMIN 3.0*   Cardiac Enzymes No results for input(s): TROPONINI, PROBNP in the last 168 hours. Glucose  Recent Labs Lab 07/03/2015 1502  GLUCAP 131*    Imaging   CXR: 7/31 cardiomegally, left subclavian vein  CVL,  R breast implant  ASSESSMENT / PLAN:  Principal Problem:   Stroke Active Problems:   Hypertension  NEUROLOGIC A: Acute ischemic L MCA stroke, L carotid artery occlusion Cerebral edema with midline shift P:   Per neurology ASA 3% saline Repeat imaging, prognosis discussion per neurology  PULMONARY A: Aspiration risk Poor airway protection P:   Maintain NPO Aspiration risk SLP eval  CARDIOVASCULAR A: HTN post ischemic stroke P:   Discussed with neuro goal is SBP < 180 Labetalol prn  RENAL A: Theraputic hypernatremia Hypokalemia P:   Monitor BMET and UOP Replace electrolytes as needed  GASTROINTESTINAL A: Aspiration risk P:   NPO SLP eval May need panda  HEMATOLOGIC A: No acute issues P:   Monitor for bleeding  INFECTIOUS A: No active issues P:    ENDOCRINE A: Hyperglycemia P:   Goal Glucose < 180 in setting of acute stroke SSI   BEST PRACTICE / DISPOSITION Level of Care:   ICU Primary Service:  PCCM Consultants:  Neurology Code Status:  DNR/DNI Diet:  NPO DVT Px:  Heparin GI Px:  None Skin Integrity:  Intact Social / Family:  Updated today  TODAY'S SUMMARY: 80 y/o woman with large L MCA stroke, now DNR/DNI. Admitted to ICU for 3% saline.  I have personally obtained a history, examined the patient, evaluated laboratory and imaging results, formulated the assessment and plan and placed orders.  CRITICAL CARE: The patient is critically ill with multiple organ systems failure and requires high complexity decision making for assessment and support, frequent evaluation and titration of therapies, application of advanced monitoring technologies and extensive interpretation of multiple databases. Critical Care Time devoted to patient care services described in this note is 35 minutes.   Heber Brownlee Park, MD [place] [facility] Pager: [phone] Cell: [phone] After 3pm or if no response, call [phone]    07/18/2015, 9:59 AM

## 2015-07-18 NOTE — Progress Notes (Signed)
CSW spoke very briefly (phone) with pt's daughter Suzan Slick Trotter-[phone]) re: role of CSW/discharge planning.  At this juncture, daughter prefers pt to to SNF in [facility] as opposed to [facility], as it would be more convenient for pt's family.  Daughter requests that CSW call back tomorrow for further info. Unit CSW will be updated and will f/u.

## 2015-07-18 NOTE — Evaluation (Signed)
Physical Therapy Evaluation Patient Details Name: Charlene Ellis MRN: [id number] DOB: 02-19-1935 Today's Date: 07/18/2015   History of Present Illness  80 y/o woman who presented 7/29 to OSH w/ complaints of R sided weakness and was diagnosed with TIA and sent home. On 7/30 her daughter found her non-verbal and completely limp on right side. She was found to have a large L MCA stroke with 4 mm midline shift, and was transferred to [facility].   Clinical Impression  Pt admitted with above diagnosis. Pt currently with functional limitations due to the deficits listed below (see PT Problem List).  Pt will benefit from skilled PT to increase their independence and safety with mobility to allow discharge to the venue listed below.       Follow Up Recommendations CIR    Equipment Recommendations  Other (comment) (to be determined)    Recommendations for Other Services Rehab consult     Precautions / Restrictions Precautions Precautions: Fall      Mobility  Bed Mobility Overal bed mobility: Needs Assistance Bed Mobility: Supine to Sit     Supine to sit: Total assist     General bed mobility comments: Noted pt actively lifted head and flexed upper trunk in prep for getting up to EOB; Required totoal assist, and using bed pad to cradle/control hips and to assist LEs of of the EOB; total assist to elevate trunk semi-sidelie to sit  Transfers Overall transfer level: Needs assistance Equipment used:  (bed pad to cradle hips) Transfers: Sit to/from Stand Sit to Stand: Max assist         General transfer comment: blocked L knee with initial trial of sit to stand transfer using bed pad to cradle hips; did not note RLE muscle recruitment in response to translating her center of mass over her feet  Ambulation/Gait                Stairs            Wheelchair Mobility    Modified Rankin (Stroke Patients Only) Modified Rankin (Stroke Patients Only) Pre-Morbid  Rankin Score: No symptoms Modified Rankin: Severe disability     Balance Overall balance assessment: Needs assistance     Sitting balance - Comments: sat EOB approx 5-7 minutes with moderate assist to keep balance; noted pusher-like movement of LUE, pushing towards weaker right side and resulting in trunk assymetry and tending to lose balance to right                                     Pertinent Vitals/Pain Pain Assessment: Faces Faces Pain Scale: Hurts little more Pain Location: Noted Grimace with movement; Grimace may be related to effort as well Pain Descriptors / Indicators: Grimacing Pain Intervention(s): Limited activity within patient's tolerance;Monitored during session;Other (comment) (watched vital signs closely during session)    Home Living Family/patient expects to be discharged to:: Inpatient rehab Living Arrangements: Children (eith daughter)                    Prior Function Level of Independence: Independent               Hand Dominance        Extremity/Trunk Assessment   Upper Extremity Assessment: Defer to OT evaluation (RUE dense hemiplegia)           Lower Extremity Assessment: RLE deficits/detail RLE Deficits /  Details: No voluntary movement noted; resistence to passively flexing hip and knee    Cervical / Trunk Assessment: Other exceptions  Communication   Communication: Receptive difficulties;Expressive difficulties  Cognition Arousal/Alertness: Awake/alert (Eyes open the entire time pt was sitting up) Behavior During Therapy: Restless (lots of movement of L UE) Overall Cognitive Status: Impaired/Different from baseline Area of Impairment: Attention;Following commands   Current Attention Level: Sustained   Following Commands: Follows one step commands inconsistently;Follows one step commands with increased time       General Comments: Will follow commands after demonstraion of command; Attends to when you  are talking to her; makes eye contact; noted purposeful lifting of head, flexing trunk, and LUE reaching up and across in effort to sit up    General Comments General comments (skin integrity, edema, etc.): HR and BP went up during activity (was in sinus brady upon my arrival); RN notified    Exercises        Assessment/Plan    PT Assessment Patient needs continued PT services  PT Diagnosis Hemiplegia dominant side;Difficulty walking   PT Problem List Decreased strength;Decreased range of motion;Decreased activity tolerance;Decreased balance;Decreased mobility;Decreased coordination;Decreased cognition;Decreased knowledge of use of DME;Decreased safety awareness;Decreased knowledge of precautions;Impaired tone  PT Treatment Interventions Gait training;Stair training;DME instruction;Functional mobility training;Therapeutic activities;Therapeutic exercise;Balance training;Neuromuscular re-education;Cognitive remediation;Patient/family education;Wheelchair mobility training   PT Goals (Current goals can be found in the Care Plan section) Acute Rehab PT Goals Patient Stated Goal: Unable to state PT Goal Formulation: With patient Time For Goal Achievement: 08/01/15 Potential to Achieve Goals: Fair    Frequency Min 4X/week   Barriers to discharge   Need more info re: any available assist     Co-evaluation               End of Session Equipment Utilized During Treatment:  (bed pad to cradle hips) Activity Tolerance: Patient tolerated treatment well Patient left: in bed;with call bell/phone within reach;with bed alarm set Nurse Communication: Mobility status         Time: 4098-1191 PT Time Calculation (min) (ACUTE ONLY): 20 min   Charges:   PT Evaluation $Initial PT Evaluation Tier I: 1 Procedure     PT G CodesVan Clines Hamff 07/18/2015, 2:05 PM  Van Clines, PT  Acute Rehabilitation Services Pager [phone] Office [phone]

## 2015-07-18 NOTE — Progress Notes (Signed)
STROKE TEAM PROGRESS NOTE   HISTORY Charlene Ellis is an 80 y.o. female without significant past medical history, brought in via EMS for evaluation of the above stated symptoms/signs. Patient was initially evaluated at [facility] ED who contacted our interventional neuroradiologist who recommended activating the code stroke and transferring patient to [facility] for possible endovascular intervention. Patient daughter is at the bedside and said that she last saw Charlene Ellis normal around 6 pm yesterday, then by 7 pm she noticed that she was poorly responsive, unable to communicate ,and not having normal mobility in her right side and thus she decided to seek medical attention. As per daughter, patient had CT brain and blood testing and released from [facility] with a diagnosis of " a minor stroke". However, she stresses the fact that her mother symptoms never resolved completely.  Then, this morning around 7 am she fell out the bed and was found unresponsive, unable to speak, not moving the right side. EMS was called and patient seen again at [facility] where she had a repeat CT brain that was unremarkable for acute abnormality but CUS revealed a left ICA occluded by a clot. NIHSS 25. CTA neck: the left common carotid artery is occluded 2.5 cm above the arch. The very distal left ICA terminus is opacified without contrast in the cavernous segment. CTA brain: large left MCA territory infarct. Dense left MCA occlusion without significant collaterals. High-grade stenosis of the non dominant proximal right vertebral artery. CTP demonstrated a large core infarct involving the majority of the left MCA territory with a smaller penumbra along the margin of the left temporal lobe.   Date last known well: 07/16/15 Time last known well: 6 pm tPA Given: no, out of the window NIHSS: 25 MRS: 0    SUBJECTIVE (INTERVAL HISTORY) Her exam was done at the bedside.  No family present.  Overall she cannot discuss her  condition due to aphasia  OBJECTIVE Temp:  [98 F (36.7 C)-99.7 F (37.6 C)] 99.7 F (37.6 C) (07/31 0400) Pulse Rate:  [56-95] 75 (07/31 0700) Cardiac Rhythm:  [-] Normal sinus rhythm (07/30 1900) Resp:  [17-25] 25 (07/31 0700) BP: (126-183)/(41-98) 183/77 mmHg (07/31 0700) SpO2:  [88 %-100 %] 97 % (07/31 0700) Weight:  [67.7 kg (149 lb 4 oz)] 67.7 kg (149 lb 4 oz) (07/30 1834)   Recent Labs Lab 06/23/2015 1502  GLUCAP 131*    Recent Labs Lab 06/28/2015 1501 07/02/2015 1904 07/04/2015 2130 07/18/15 0515 07/18/15 0615  NA 138 141 141 146* 147*  K 3.7  --   --  3.2*  --   CL 109  --   --  116*  --   CO2 19*  --   --  24  --   GLUCOSE 146*  --   --  138*  --   BUN 15  --   --  13  --   CREATININE 0.87  --   --  0.82  --   CALCIUM 9.0  --   --  8.9  --     Recent Labs Lab 07/18/15 0515  AST 18  ALT 12*  ALKPHOS 79  BILITOT 0.8  PROT 5.7*  ALBUMIN 3.0*    Recent Labs Lab 06/29/2015 1501 07/18/15 0515  WBC 9.8 10.2  HGB 11.7* 10.2*  HCT 34.5* 30.3*  MCV 86.3 86.3  PLT PLATELET CLUMPS NOTED ON SMEAR, COUNT APPEARS DECREASED PLATELET CLUMPING, SUGGEST RECOLLECTION OF SAMPLE IN CITRATE TUBE.   No results  for input(s): CKTOTAL, CKMB, CKMBINDEX, TROPONINI in the last 168 hours.  Recent Labs  07/13/2015 1501  LABPROT 14.6  INR 1.12   No results for input(s): COLORURINE, LABSPEC, PHURINE, GLUCOSEU, HGBUR, BILIRUBINUR, KETONESUR, PROTEINUR, UROBILINOGEN, NITRITE, LEUKOCYTESUR in the last 72 hours.  Invalid input(s): APPERANCEUR     Component Value Date/Time   CHOL 264* 07/18/2015 0445   TRIG 79 07/18/2015 0445   HDL 67 07/18/2015 0445   CHOLHDL 3.9 07/18/2015 0445   VLDL 16 07/18/2015 0445   LDLCALC 181* 07/18/2015 0445   No results found for: HGBA1C No results found for: LABOPIA, COCAINSCRNUR, LABBENZ, AMPHETMU, THCU, LABBARB  No results for input(s): ETH in the last 168 hours.    Imaging   Ct Angio Head and Neck W/cm &/or Wo Cm 06/24/2015    1. Large  left MCA territory infarct.  2. The left common carotid artery is occluded 2.5 cm above the arch.  3. The very distal left ICA terminus is opacified without contrast in the cavernous segment.  4. Dense left MCA occlusion without significant collaterals.  5. Atherosclerotic calcifications at the right carotid bifurcation and cavernous internal carotid artery without significant stenosis.  6. High-grade stenosis of the non dominant proximal right vertebral artery.  7. Moderate distal small vessel disease within MCA branches on the right, bilateral ACA branches, and posterior cerebral arteries, worse on the right.  8. 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  9. Bilateral pleural effusions, right greater than left.  10. Pulmonary edema is also worse on the right.       Mr Brain Wo Contrast 06/24/2015    1. Large left MCA acute nonhemorrhagic infarct with mass effect and 4 mm midline shift.  2. Sparing of the left temporal lobe.  3. Left ICA and MCA occlusion.  4. Extensive atrophy and white matter disease compatible with chronic underlying microvascular ischemia.      Ct Cerebral Perfusion W/cm 06/29/2015    1. Large core infarct involving the majority of the left MCA territory. A smaller penumbra is seen along the left temporal lobe.  2. The infarct spares the left caudate head and anterior lentiform nucleus.     Dg Chest Port 1 View 07/16/2015    1. Interval placement of a left subclavian approach central venous catheter with tip overlying the mid right atrium.  2. Similar appearance of diffuse pulmonary interstitial edema as compared to prior radiograph performed earlier the same day.       PHYSICAL EXAM  Elderly acute ill female in no apparent distress. Blood pressure 180/66, pulse 95, temperature 98 F (36.7 C), temperature source Oral, resp. rate 25, SpO2 97 %. Head: normocephalic. Neck: supple, no bruits, no  JVD. Cardiac: no murmurs. Lungs: clear. Abdomen: soft, no tender, no mass. Extremities: no edema. Skin: no rash  Neurologic Examination: General: Mental Status: Awake but with global aphasia.  Cranial Nerves: II: pupils equal, round, reactive to light III,IV, VI: ptosis not present, left gaze preference V,VII: smile asymmetric due to right face weakness, facial light touch sensation unreliable VIII: hearing grossly appropriate XI: bilateral shoulder shrug no tested XII: tongue could not be assessed  Motor: Dense right hemiplegia Tone decreased in the right side Sensory: reacts to pain on right but doesn't localize; appropriate on left Deep Tendon Reflexes:  1+ all over Plantars: Right: upgoingLeft: upgoing Cerebellar: Unable to assess due to mental status Gait:  Unable to assess due to mental status  ASSESSMENT Charlene Ellis is a 80 y.o. female with history of obstructive sleep apnea presenting with decreased responsiveness, inability to communicate, and right-sided weakness.. She did not receive IV t-PA due to late presentation  Stroke:  Dominant infarct probably embolic secondary to secondary to occlusion of the left common carotid artery.  Resultant  Right side hemiplegia and global aphasia  MRI  Large left MCA acute nonhemorrhagic infarct with mass effect and 4 mm midline shift.   MRA  not performed  CTA head and neck - left common carotid artery occlusion. Left MCA occlusion. High-grade stenosis right VA.  Carotid Doppler  please refer to the CTA of the neck as noted above.  2D Echo  pending  LDL 181  HgbA1c pending  Subcutaneous heparin for VTE prophylaxis  Diet NPO time specified  aspirin 325 mg orally every day prior to admission, now on aspirin 300 mg suppository daily  Ongoing aggressive stroke risk factor management  Therapy recommendations:  Pending  Disposition: Pending  Hypertension  Home meds:  Losartan  100 mg daily  Stable  Permissive hypertension <220/120 for 24-48 hours and then gradually normalize within 5-7 days   Hyperlipidemia  Home meds:  Mevacor 10 mg daily - NPO  LDL 181, goal < 70  Add Lipitor 40 mg daily when taking POs.  Continue statin at discharge    Other Stroke Risk Factors  Advanced age  Obstructive sleep apnea, on CPAP at home   Other Pertinent History  DNR     Hospital day # 1   Delton See PA-C [facility] Pager [phone] 07/18/2015, 8:10 AM    NEUROCRITICAL ATTENDING NOTE: Patient was seen and examined by me personally. I reviewed notes, independently viewed imaging studies, participated in medical decision making and plan of care. I have made additions or clarifications directly to the above note.  Documentation accurately reflects findings. The laboratory and radiographic studies were personally reviewed by me.  ROS could not be fully documented due to LOC and aphasia  Assessment and plan completed by me personally and fully documented above.  Plans include:   Neuro  3% saline discontinued; full left MCA; fair amount of atrophy.  Will hold off for now  Cardiac  2-D echo pending  Cardiac enzymes elevated; will continue to follow trend; ASA/beta blocker/statin therapy; stroke too large for heparin and major intervention at this time  Pulmonary  Bilateral pleural effusions right greater than left  Pulmonary edema on CTA; lasix IV; repeat CXR in AM  GI  NPO  GU   No acute issues  Endo  Hemoglobin A1c pending  Heme / ID  Contact precautions - MRSA, ESBL, RSV  Mild anemia with platelet clumping  Electrolytes  Potassium 3.2 - supplemented 40 meq - recheck in a.m.  Sodium 147   This patient is critically ill and at significant risk of neurological worsening, death and care requires constant monitoring of vital signs, hemodynamics,respiratory and cardiac monitoring, extensive review of  multiple databases, frequent neurological assessment, discussion with family, other specialists and medical decision making of high complexity.  This critical care time does not reflect procedure time, or teaching time or supervisory time of PA/NP/Med Resident etc. but could involve care discussion time.  I spent 30 minutes of Neurocritical Care time in the care of  this patient.  SIGNED BY: Dr. Sula Soda      To contact Stroke Continuity provider, please refer to [email or web address]. After hours, contact General Neurology

## 2015-07-18 NOTE — Progress Notes (Signed)
Utilization Review Completed.Charlene Ellis T7/31/2016  

## 2015-07-19 ENCOUNTER — Inpatient Hospital Stay (HOSPITAL_COMMUNITY): Payer: PPO

## 2015-07-19 DIAGNOSIS — I63512 Cerebral infarction due to unspecified occlusion or stenosis of left middle cerebral artery: Secondary | ICD-10-CM | POA: Insufficient documentation

## 2015-07-19 DIAGNOSIS — I6789 Other cerebrovascular disease: Secondary | ICD-10-CM

## 2015-07-19 DIAGNOSIS — I63412 Cerebral infarction due to embolism of left middle cerebral artery: Secondary | ICD-10-CM | POA: Diagnosis not present

## 2015-07-19 DIAGNOSIS — E785 Hyperlipidemia, unspecified: Secondary | ICD-10-CM

## 2015-07-19 DIAGNOSIS — I639 Cerebral infarction, unspecified: Secondary | ICD-10-CM | POA: Insufficient documentation

## 2015-07-19 DIAGNOSIS — I10 Essential (primary) hypertension: Secondary | ICD-10-CM

## 2015-07-19 LAB — GLUCOSE, CAPILLARY
GLUCOSE-CAPILLARY: 139 mg/dL — AB (ref 65–99)
GLUCOSE-CAPILLARY: 144 mg/dL — AB (ref 65–99)
Glucose-Capillary: 167 mg/dL — ABNORMAL HIGH (ref 65–99)
Glucose-Capillary: 139 mg/dL — ABNORMAL HIGH (ref 65–99)
Glucose-Capillary: 136 mg/dL — ABNORMAL HIGH (ref 65–99)
Glucose-Capillary: 136 mg/dL — ABNORMAL HIGH (ref 65–99)
Glucose-Capillary: 138 mg/dL — ABNORMAL HIGH (ref 65–99)

## 2015-07-19 LAB — CBC
HEMATOCRIT: 32 % — AB (ref 36.0–46.0)
Hemoglobin: 10.7 g/dL — ABNORMAL LOW (ref 12.0–15.0)
MCH: 29.1 pg (ref 26.0–34.0)
MCHC: 33.4 g/dL (ref 30.0–36.0)
MCV: 87 fL (ref 78.0–100.0)
RBC: 3.68 MIL/uL — AB (ref 3.87–5.11)
RDW: 14.5 % (ref 11.5–15.5)
WBC: 13.9 10*3/uL — AB (ref 4.0–10.5)

## 2015-07-19 LAB — BASIC METABOLIC PANEL
ANION GAP: 10 (ref 5–15)
Anion gap: 10 (ref 5–15)
BUN: 16 mg/dL (ref 6–20)
BUN: 23 mg/dL — ABNORMAL HIGH (ref 6–20)
CO2: 20 mmol/L — AB (ref 22–32)
CO2: 19 mmol/L — AB (ref 22–32)
CREATININE: 0.89 mg/dL (ref 0.44–1.00)
Calcium: 9.4 mg/dL (ref 8.9–10.3)
Calcium: 9.8 mg/dL (ref 8.9–10.3)
Chloride: 124 mmol/L — ABNORMAL HIGH (ref 101–111)
Chloride: 130 mmol/L — ABNORMAL HIGH (ref 101–111)
Creatinine, Ser: 0.74 mg/dL (ref 0.44–1.00)
GFR calc Af Amer: >60 mL/min (ref >60)
GFR calc Af Amer: >60 mL/min (ref >60)
GFR calc non Af Amer: >60 mL/min (ref >60)
GLUCOSE: 149 mg/dL — AB (ref 65–99)
Glucose, Bld: 153 mg/dL — ABNORMAL HIGH (ref 65–99)
Potassium: 2.8 mmol/L — ABNORMAL LOW (ref 3.5–5.1)
Potassium: 3.3 mmol/L — ABNORMAL LOW (ref 3.5–5.1)
SODIUM: 154 mmol/L — AB (ref 135–145)
Sodium: 159 mmol/L — ABNORMAL HIGH (ref 135–145)

## 2015-07-19 LAB — SODIUM
SODIUM: 159 mmol/L — AB (ref 135–145)
Sodium: 158 mmol/L — ABNORMAL HIGH (ref 135–145)
Sodium: 157 mmol/L — ABNORMAL HIGH (ref 135–145)

## 2015-07-19 LAB — HEMOGLOBIN A1C
HEMOGLOBIN A1C: 5.7 % — AB (ref 4.8–5.6)
Hgb A1c MFr Bld: 5.8 % — ABNORMAL HIGH (ref 4.8–5.6)
MEAN PLASMA GLUCOSE: 120 mg/dL
MEAN PLASMA GLUCOSE: 117 mg/dL

## 2015-07-19 LAB — CLOSTRIDIUM DIFFICILE BY PCR: Toxigenic C. Difficile by PCR: NEGATIVE

## 2015-07-19 MED ORDER — VITAL HIGH PROTEIN PO LIQD
1000.0000 mL | ORAL | Status: DC
  Administered 2015-07-19: 17:00:00
  Administered 2015-07-19 – 2015-07-20 (×3): 1000 mL
  Filled 2015-07-19 (×3): qty 1000

## 2015-07-19 MED ORDER — POTASSIUM CHLORIDE 10 MEQ/50ML IV SOLN
10.0000 meq | INTRAVENOUS | Status: AC
  Administered 2015-07-19 (×8): 10 meq via INTRAVENOUS
  Filled 2015-07-19 (×8): qty 50

## 2015-07-19 MED ORDER — POTASSIUM CHLORIDE 20 MEQ/15ML (10%) PO SOLN: 40.0000 meq | ORAL | Status: DC

## 2015-07-19 NOTE — Progress Notes (Signed)
Speech Language Pathology Treatment: Dysphagia  Patient Details Name: Charlene Ellis MRN: [id number] DOB: [birth year] Today's Date: 07/19/2015 Time: 4098-1191 SLP Time Calculation (min) (ACUTE ONLY): 19 min  Assessment / Plan / Recommendation Clinical Impression  Pt was asleep upon SLP arrival, but becomes more alert with repositioning and oral care. Pt's oral acceptance of ice chips improved as trials progressed, although oral phase remains prolonged. Immediate coughing is noted with single ice chips and small (<1 tsp) boluses of water, followed by spike in RR to the low 30s with quick recover. Recommend to remain NPO - may wish to consider at least temporary alternative means of nutrition.   HPI Other Pertinent Information: 80 y/o woman who presented 7/29 to OSH w/ complaints of R sided weakness and was diagnosed with TIA and sent home. On 7/30 her daughter found her non-verbal and completely limp on right side. She was found to have a large L MCA stroke with 4 mm midline shift, and was transferred to [facility].    Pertinent Vitals Pain Assessment: Faces Faces Pain Scale: No hurt  SLP Plan  Continue with current plan of care    Recommendations Diet recommendations: NPO Medication Administration: Via alternative means       Oral Care Recommendations: Oral care QID Follow up Recommendations: Inpatient Rehab Plan: Continue with current plan of care     Maxcine Ham, M.A. CCC-SLP [phone]  Maxcine Ham 07/19/2015, 10:50 AM

## 2015-07-19 NOTE — Evaluation (Signed)
Speech Language Pathology Evaluation Patient Details Name: Charlene Ellis MRN: [id number] DOB: June 06, 1935 Today's Date: 07/19/2015 Time: 4098-1191 SLP Time Calculation (min) (ACUTE ONLY): 19 min  Problem List:  Patient Active Problem List   Diagnosis Date Noted  . Stroke 2015/08/08  . Hypertension 08-08-2015   Past Medical History:  80 Date  . Hypertension   . Arthritis   . History of hip replacement   . History of breast augmentation    Past Surgical History: History reviewed. No pertinent past surgical history. HPI:  80 y/o woman who presented 7/29 to OSH w/ complaints of R sided weakness and was diagnosed with TIA and sent home. On 7/30 her daughter found her non-verbal and completely limp on right side. She was found to have a large L MCA stroke with 4 mm midline shift, and was transferred to [facility].    Assessment / Plan / Recommendation Clinical Impression  Pt presents with a global aphasia. No vocalizations were elicited throughout assessment, although vocal fold adduction was adequate for spontaneous coughing. She has no response to basic yes/no questions and does not identify common objects from a field of two. Total A was provided for completion of one-step commands. Pt will benefit from SLP f/u both acutely and at CIR level to maximize functional communication.    SLP Assessment  Patient needs continued Speech Lanaguage Pathology Services    Follow Up Recommendations  Inpatient Rehab    Frequency and Duration min 3x week  1 week   Pertinent Vitals/Pain Pain Assessment: Faces Faces Pain Scale: No hurt   SLP Goals  Progression toward goals: Progressing toward goals Patient/Family Stated Goal: pt unable to state Potential to Achieve Goals (ACUTE ONLY): Fair Potential Considerations (ACUTE ONLY): Severity of impairments  SLP Evaluation Prior Functioning  Cognitive/Linguistic Baseline: Information not available   Cognition  Overall Cognitive Status: Difficult to assess (aphasia) Arousal/Alertness: Lethargic    Comprehension  Auditory Comprehension Overall Auditory Comprehension: Impaired Yes/No Questions: Impaired Basic Biographical Questions: 0-25% accurate Commands: Impaired One Step Basic Commands: 0-24% accurate Visual Recognition/Discrimination Discrimination: Exceptions to [facility] Common Objects: Unable to indentify Reading Comprehension Reading Status: Not tested    Expression Expression Primary Mode of Expression: Other (comment) (nonverbal) Verbal Expression Overall Verbal Expression: Impaired Initiation: Impaired Common Objects: Unable to indentify   Oral / Motor Motor Speech Overall Motor Speech: Other (comment) (UTA)       Maxcine Ham, M.A. CCC-SLP [phone]  Maxcine Ham 07/19/2015, 11:02 AM

## 2015-07-19 NOTE — Progress Notes (Signed)
Occupational Therapy Evaluation Patient Details Name: Charlene Ellis MRN: [id number] DOB: Dec 12, 1935 Today's Date: 07/19/2015    History of Present Illness 80 y/o woman who presented 7/29 to OSH w/ complaints of R sided weakness and was diagnosed with TIA and sent home. On 7/30 her daughter found her non-verbal and completely limp on right side. She was found to have a large L MCA stroke with 4 mm midline shift, and was transferred to [facility].    Clinical Impression   PTA, pt independent with ADL and mobility. Pt presents with significant deficits as listed below. Pt is a good CIR candidate, however, family prefers pt to undergo rehab at SNF closer to home. Will follow acutely to facilitate D/C to next venue of care and maximize independence with ADL and mobility.     Follow Up Recommendations  SNF;Supervision/Assistance - 24 hour    Equipment Recommendations  3 in 1 bedside comode;Tub/shower bench    Recommendations for Other Services       Precautions / Restrictions Precautions Precautions: Fall      Mobility Bed Mobility Overal bed mobility: Needs Assistance Bed Mobility: Supine to Sit     Supine to sit: Total assist     General bed mobility comments: Pt actively lifted head and pulled self with LUE  Transfers Overall transfer level: Needs assistance Equipment used:  (bed pad to cradle hips)   Sit to Stand: Max assist         General transfer comment: blocked L knee with initial trial of sit to stand transfer using bed pad to cradle hips; did not note RLE muscle recruitment in response to translating her center of mass over her feet    Balance Overall balance assessment: Needs assistance   Sitting balance-Leahy Scale: Poor                                      ADL Overall ADL's : Needs assistance/impaired Eating/Feeding: NPO                                   Functional mobility during ADLs: Total assistance General  ADL Comments: total A with ADL at this time. attempted to use gestural cues to facilitate wiping face. Pt continued to reach for therapist and pull at gown     Vision Vision Assessment?: Vision impaired- to be further tested in functional context Additional Comments: inattention R field   Perception     Praxis Praxis Praxis tested?: Deficits Deficits: Initiation;Perseveration    Pertinent Vitals/Pain Pain Assessment: Faces Faces Pain Scale: No hurt     Hand Dominance Right   Extremity/Trunk Assessment Upper Extremity Assessment Upper Extremity Assessment: RUE deficits/detail RUE Deficits / Details: increased flexor tone throughout. no spontaneous movement observed RUE Sensation:  (no response to noxious stimulation) RUE Coordination: decreased fine motor;decreased gross motor (nonfunctional use of RUE)   Lower Extremity Assessment Lower Extremity Assessment: Defer to PT evaluation (tone apparent) RLE Deficits / Details: No voluntary movement noted; resistence to passively flexing hip and knee   Cervical / Trunk Assessment Cervical / Trunk Assessment: Other exceptions Cervical / Trunk Exceptions: actively extending head. kyphotic. R bias   Communication Communication Communication: Receptive difficulties;Expressive difficulties   Cognition Arousal/Alertness: Awake/alert (Eyes open the entire time pt was sitting up) Behavior During Therapy: Restless;Flat affect (lots of  movement of L UE) Overall Cognitive Status: Impaired/Different from baseline Area of Impairment: Attention;Following commands;Problem solving;Safety/judgement;Awareness   Current Attention Level: Sustained   Following Commands: Follows one step commands inconsistently;Follows one step commands with increased time (stuck out tongue)     Problem Solving: Slow processing;Decreased initiation;Difficulty sequencing;Requires verbal cues;Requires tactile cues General Comments: does better with gestural cues    General Comments       Exercises Exercises: Other exercises Other Exercises Other Exercises: PROM RUE. elevated on 2 pillows   Shoulder Instructions      Home Living Family/patient expects to be discharged to:: Skilled nursing facility Living Arrangements: Children (with daughter)                                      Prior Functioning/Environment Level of Independence: Independent   Lived with daughter. Per son, pt is hoarder and has "tons of cats". Animal control has removed numerus cats.           OT Diagnosis: Generalized weakness;Cognitive deficits;Disturbance of vision;Hemiplegia dominant side;Apraxia   OT Problem List: Decreased strength;Decreased range of motion;Decreased activity tolerance;Impaired balance (sitting and/or standing);Impaired vision/perception;Decreased coordination;Decreased cognition;Decreased safety awareness;Decreased knowledge of use of DME or AE;Impaired sensation;Impaired tone;Impaired UE functional use   OT Treatment/Interventions: Self-care/ADL training;Therapeutic exercise;Neuromuscular education;DME and/or AE instruction;Therapeutic activities;Cognitive remediation/compensation;Visual/perceptual remediation/compensation;Patient/family education;Balance training    OT Goals(Current goals can be found in the care plan section) Acute Rehab OT Goals Patient Stated Goal: Unable to state OT Goal Formulation: With family Time For Goal Achievement: 08/02/15 Potential to Achieve Goals: Good  OT Frequency: Min 2X/week   Barriers to D/C:    caregivers not available 24/7.        Co-evaluation              End of Session Nurse Communication: Mobility status  Activity Tolerance: Patient tolerated treatment well Patient left: in bed;with call bell/phone within reach;with family/visitor present   Time: 1655-1730 OT Time Calculation (min): 35 min Charges:  OT General Charges $OT Visit: 1 Procedure OT Evaluation $Initial OT  Evaluation Tier I: 1 Procedure OT Treatments $Self Care/Home Management : 8-22 mins G-Codes:    Ankith Edmonston,HILLARY Jul 26, 2015, 5:36 PM   [facility], OTR/L  [phone] 07-26-15

## 2015-07-19 NOTE — Progress Notes (Signed)
  Echocardiogram 2D Echocardiogram has been performed.  Charlene Ellis 07/19/2015, 12:35 PM

## 2015-07-19 NOTE — Progress Notes (Signed)
STROKE TEAM PROGRESS NOTE   HISTORY Charlene Ellis is an 80 y.o. female without significant past medical history, brought in via EMS for evaluation of the above stated symptoms/signs. Patient was initially evaluated at [facility] who contacted our interventional neuroradiologist who recommended activating the code stroke and transferring patient to [facility] for possible endovascular intervention. Patient daughter is at the bedside and said that she last saw Charlene Ellis normal around 6 pm yesterday, then by 7 pm she noticed that she was poorly responsive, unable to communicate ,and not having normal mobility in her right side and thus she decided to seek medical attention. As per daughter, patient had CT brain and blood testing and released from [facility] with a diagnosis of " a minor stroke". However, she stresses the fact that her mother symptoms never resolved completely.  Then, this morning around 7 am she fell out the bed and was found unresponsive, unable to speak, not moving the right side. EMS was called and patient seen again at [facility] where she had a repeat CT brain that was unremarkable for acute abnormality but CUS revealed a left ICA occluded by a clot. NIHSS 25. CTA neck: the left common carotid artery is occluded 2.5 cm above the arch. The very distal left ICA terminus is opacified without contrast in the cavernous segment. CTA brain: large left MCA territory infarct. Dense left MCA occlusion without significant collaterals. High-grade stenosis of the non dominant proximal right vertebral artery. CTP demonstrated a large core infarct involving the majority of the left MCA territory with a smaller penumbra along the margin of the left temporal lobe.   Date last known well: 07/16/15 Time last known well: 6 pm tPA Given: no, out of the window NIHSS: 25 MRS: 0    SUBJECTIVE (INTERVAL HISTORY) Her exam was done at the bedside.  No family present.  Overall she cannot discuss her  condition due to aphasia. Clinically unchanged. Will repeat CT.   OBJECTIVE Temp:  [98.5 F (36.9 C)-100.4 F (38 C)] 99.7 F (37.6 C) (08/01 0759) Pulse Rate:  [46-96] 81 (08/01 0800) Cardiac Rhythm:  [-] Normal sinus rhythm (08/01 0800) Resp:  [16-32] 27 (08/01 0800) BP: (142-189)/(44-78) 185/46 mmHg (08/01 0828) SpO2:  [93 %-99 %] 98 % (08/01 0800)   Recent Labs Lab 07/18/15 1645 07/18/15 2108 07/18/15 2313 07/19/15 0417 07/19/15 0758  GLUCAP 167* 150* 139* 139* 136*    Recent Labs Lab 06/25/2015 1501  07/18/15 0515 07/18/15 0615 07/18/15 1050 07/18/15 1712 07/18/15 2215 07/19/15 0440  NA 138  < > 146* 147* 149* 150* 153* 154*  K 3.7  --  3.2*  --   --   --   --  2.8*  CL 109  --  116*  --   --   --   --  124*  CO2 19*  --  24  --   --   --   --  20*  GLUCOSE 146*  --  138*  --   --   --   --  153*  BUN 15  --  13  --   --   --   --  16  CREATININE 0.87  --  0.82  --   --   --   --  0.74  CALCIUM 9.0  --  8.9  --   --   --   --  9.4  < > = values in this interval not displayed.  Recent Labs Lab  07/18/15 0515  AST 18  ALT 12*  ALKPHOS 79  BILITOT 0.8  PROT 5.7*  ALBUMIN 3.0*    Recent Labs Lab 06/24/2015 1501 07/18/15 0515 07/19/15 0440  WBC 9.8 10.2 13.9*  HGB 11.7* 10.2* 10.7*  HCT 34.5* 30.3* 32.0*  MCV 86.3 86.3 87.0  PLT PLATELET CLUMPS NOTED ON SMEAR, COUNT APPEARS DECREASED PLATELET CLUMPING, SUGGEST RECOLLECTION OF SAMPLE IN CITRATE TUBE. PLATELET CLUMPING, SUGGEST RECOLLECTION OF SAMPLE IN CITRATE TUBE.    Recent Labs Lab 07/18/15 1050 07/18/15 1712 07/18/15 2215  CKTOTAL 113  --   --   CKMB 2.2  --   --   TROPONINI 0.14* 0.27* 0.34*    Recent Labs  06/23/2015 1501  LABPROT 14.6  INR 1.12   No results for input(s): COLORURINE, LABSPEC, PHURINE, GLUCOSEU, HGBUR, BILIRUBINUR, KETONESUR, PROTEINUR, UROBILINOGEN, NITRITE, LEUKOCYTESUR in the last 72 hours.  Invalid input(s): APPERANCEUR     Component Value Date/Time   CHOL 264*  07/18/2015 0445   TRIG 79 07/18/2015 0445   HDL 67 07/18/2015 0445   CHOLHDL 3.9 07/18/2015 0445   VLDL 16 07/18/2015 0445   LDLCALC 181* 07/18/2015 0445   No results found for: HGBA1C No results found for: LABOPIA, COCAINSCRNUR, LABBENZ, AMPHETMU, THCU, LABBARB  No results for input(s): ETH in the last 168 hours.    Imaging  Ct Angio Head and Neck W/cm &/or Wo Cm 07/08/2015    1. Large left MCA territory infarct.  2. The left common carotid artery is occluded 2.5 cm above the arch.  3. The very distal left ICA terminus is opacified without contrast in the cavernous segment.  4. Dense left MCA occlusion without significant collaterals.  5. Atherosclerotic calcifications at the right carotid bifurcation and cavernous internal carotid artery without significant stenosis.  6. High-grade stenosis of the non dominant proximal right vertebral artery.  7. Moderate distal small vessel disease within MCA branches on the right, bilateral ACA branches, and posterior cerebral arteries, worse on the right.  8. 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  9. Bilateral pleural effusions, right greater than left.  10. Pulmonary edema is also worse on the right.   CT head 07/19/15 Subacute infarct left MCA territory with edema and mass-effect. No hemorrhage. Slight increase in midline shift since the prior study.  Mr Brain Wo Contrast 06/26/2015    1. Large left MCA acute nonhemorrhagic infarct with mass effect and 4 mm midline shift.  2. Sparing of the left temporal lobe.  3. Left ICA and MCA occlusion.  4. Extensive atrophy and white matter disease compatible with chronic underlying microvascular ischemia.     Ct Cerebral Perfusion W/cm 07/15/2015    1. Large core infarct involving the majority of the left MCA territory. A smaller penumbra is seen along the left temporal lobe.  2. The infarct spares the left caudate head and anterior  lentiform nucleus.   Dg Chest Port 1 View 07/16/2015    1. Interval placement of a left subclavian approach central venous catheter with tip overlying the mid right atrium.  2. Similar appearance of diffuse pulmonary interstitial edema as compared to prior radiograph performed earlier the same day.     2D echo - pending  PHYSICAL EXAM   Temp:  [98.5 F (36.9 C)-100.4 F (38 C)] 99.7 F (37.6 C) (08/01 0759) Pulse Rate:  [46-96] 81 (08/01 0800) Resp:  [16-32] 27 (08/01 0800) BP: (142-189)/(44-78) 185/46 mmHg (08/01 0828) SpO2:  [  93 %-99 %] 98 % (08/01 0800)  General - Well nourished, well developed, mild agitation, global aphasia.  Ophthalmologic - Fundi not visualized due to noncooperation.  Cardiovascular - Regular rate and rhythm with no murmur.  Neuro - awake, eyes open, mild agitation, not following commands, global aphasia. Right neglect, not blinking to visual threat on the right, PERRL, right facial droop, LUE and LLE spontaneous movement, RUE 0/5, RLE 1/5 on pain stimulation, RUE and RLE increased muscle tone and positive babinski. Sensation and gait not tested.   ASSESSMENT Charlene Ellis is a 80 y.o. female with history of obstructive sleep apnea presenting with decreased responsiveness, inability to communicate, and right-sided weakness.. She did not receive IV t-PA due to late presentation  Stroke:  Dominant left MCA infarct probably embolic secondary to occlusion of the left common carotid artery.  Resultant  Right side hemiplegia and global aphasia  MRI  Large left MCA acute infarct with mass effect with midline shift.   CTA head and neck - left common carotid artery occlusion. Left MCA occlusion. High-grade stenosis right VA.  Repeat CT showed enlargement of previous infarct.  2D Echo  pending  LDL 181  HgbA1c 5.7  Subcutaneous heparin for VTE prophylaxis Diet NPO time specified  aspirin 325 mg orally every day prior to admission, now on aspirin 325  mg daily  Ongoing aggressive stroke risk factor management  Therapy recommendations:  Pending  Disposition: Pending  Cerebral edema  Off 3% saline now due to high Na  Na 159  Na goal 150-190  Na Q6h  Close monitoring  Hypertension  Home meds:  Losartan 100 mg daily  Stable  Permissive hypertension <220/120 for 24-48 hours and then gradually normalize within 5-7 days  Hyperlipidemia  Home meds:  Mevacor 10 mg daily - NPO  LDL 181, goal < 70  Add Lipitor 40 mg daily when taking POs.  Continue statin at discharge  Other Stroke Risk Factors  Advanced age  Obstructive sleep apnea, on CPAP at home  Tube feeding now, did not pass swallow.  Other Pertinent History  DNR   Hospital day # 2   This patient is critically ill due to large left MCA infarct and at significant risk of neurological worsening, death form cerebral edema and brain herniation.. This patient's care requires constant monitoring of vital signs, hemodynamics, respiratory and cardiac monitoring, review of multiple databases, neurological assessment, discussion with family, other specialists and medical decision making of high complexity. I spent 40 minutes of neurocritical care time in the care of this patient.   Marvel Plan, MD PhD Stroke Neurology 07/19/2015 11:07 PM     To contact Stroke Continuity provider, please refer to [email or web address]. After hours, contact General Neurology

## 2015-07-19 NOTE — Progress Notes (Signed)
Inpatient Rehabilitation  We have received a prescreen request for possible IP Rehab admission, however I note that per Pollyann Savoy, SW , family prefers SNF in [place] due to convenience for family.  At this time, we are not recommending IP Rehab consult due to family preferences.  If family decides they would like to pursue CIR, please notify us.  Thanks you.  Weldon Picking PT Inpatient Rehab Admissions Coordinator Cell [phone] Office [phone]

## 2015-07-19 NOTE — Progress Notes (Signed)
[facility] ADULT ICU REPLACEMENT PROTOCOL FOR AM LAB REPLACEMENT ONLY  The patient does apply for the [facility] Adult ICU Electrolyte Replacment Protocol based on the criteria listed below:   1. Is GFR >/= 40 ml/min? Yes.    Patient's GFR today is >60 2. Is urine output >/= 0.5 ml/kg/hr for the last 6 hours? Yes.   Patient's UOP is 1.9 ml/kg/hr 3. Is BUN < 60 mg/dL? Yes.    Patient's BUN today is 16 4. Abnormal electrolyte(s): K 2.8 5. Ordered repletion with: per protocol 6. If a panic level lab has been reported, has the CCM MD in charge been notified? No..   Physician:    Markus Daft A 07/19/2015 6:08 AM

## 2015-07-19 NOTE — Progress Notes (Signed)
PULMONARY / CRITICAL CARE MEDICINE HISTORY AND PHYSICAL EXAMINATION   Name: Charlene Ellis MRN: [id number] DOB: Jan 08, 1935    ADMISSION DATE:  2015-08-05  PRIMARY SERVICE: PCCM  CHIEF COMPLAINT:  Nonverbal  BRIEF PATIENT DESCRIPTION: 80 y/o woman with large L MCA stroke with late presentation  SIGNIFICANT EVENTS / STUDIES:  CTA brain 7/30 > dense left MCA occlusion, left common carotid artery occlusion CT brain perfusion scan 7/30 > large core infarct of majority of left MCA MR Brain 7/30: Large left MCA acute nonhemorrhagic infarct with mass effect and 4 mm midline shift.  LINES / TUBES: - PIV - [place] 7/30 - L [place] CVC 7/30  CULTURES: MRSA screen (+)  ANTIBIOTICS: None   SUBJECTIVE: intermittent bradycardia without hypotension or change in condition, otherwise no acute issues  VITAL SIGNS: Temp:  [98.7 F (37.1 C)-100.4 F (38 C)] 99.6 F (37.6 C) (08/01 1121) Pulse Rate:  [46-102] 54 (08/01 1100) Resp:  [16-39] 25 (08/01 1100) BP: (142-189)/(44-76) 187/58 mmHg (08/01 1138) SpO2:  [93 %-99 %] 98 % (08/01 1100) HEMODYNAMICS:   VENTILATOR SETTINGS:   INTAKE / OUTPUT: Intake/Output      07/31 0701 - 08/01 0700 08/01 0701 - 08/02 0700   I.V. (mL/kg) 1200 (17.7)    IV Piggyback 100    Total Intake(mL/kg) 1300 (19.2)    Urine (mL/kg/hr) 1950 (1.2)    Stool 0 (0)    Total Output 1950     Net -650          Stool Occurrence 6 x      PHYSICAL EXAMINATION:  Gen: lying in bed, no distress HENT: scar over left eye PULM: CTA B CV: RRR, no mgr GI: BS+, soft, nontender Ext: trace edema, SCD Neuro: moves left arm and leg spontaneously  LABS:  CBC  Recent Labs Lab 05-Aug-2015 1501 07/18/15 0515 07/19/15 0440  WBC 9.8 10.2 13.9*  HGB 11.7* 10.2* 10.7*  HCT 34.5* 30.3* 32.0*  PLT PLATELET CLUMPS NOTED ON SMEAR, COUNT APPEARS DECREASED PLATELET CLUMPING, SUGGEST RECOLLECTION OF SAMPLE IN CITRATE TUBE. PLATELET CLUMPING, SUGGEST RECOLLECTION OF SAMPLE IN CITRATE  TUBE.   Coag's  Recent Labs Lab 08-05-2015 1501  INR 1.12   BMET  Recent Labs Lab 2015-08-05 1501  07/18/15 0515  07/18/15 1712 07/18/15 2215 07/19/15 0440  NA 138  < > 146*  < > 150* 153* 154*  K 3.7  --  3.2*  --   --   --  2.8*  CL 109  --  116*  --   --   --  124*  CO2 19*  --  24  --   --   --  20*  BUN 15  --  13  --   --   --  16  CREATININE 0.87  --  0.82  --   --   --  0.74  GLUCOSE 146*  --  138*  --   --   --  153*  < > = values in this interval not displayed. Electrolytes  Recent Labs Lab Aug 05, 2015 1501 07/18/15 0515 07/19/15 0440  CALCIUM 9.0 8.9 9.4   Sepsis Markers  Recent Labs Lab Aug 05, 2015 1510  LATICACIDVEN 1.58   ABG No results for input(s): PHART, PCO2ART, PO2ART in the last 168 hours. Liver Enzymes  Recent Labs Lab 07/18/15 0515  AST 18  ALT 12*  ALKPHOS 79  BILITOT 0.8  ALBUMIN 3.0*   Cardiac Enzymes  Recent Labs Lab 07/18/15 1050 07/18/15 1712  07/18/15 2215  TROPONINI 0.14* 0.27* 0.34*   Glucose  Recent Labs Lab 07/18/15 1645 07/18/15 2108 07/18/15 2313 07/19/15 0417 07/19/15 0758 07/19/15 1119  GLUCAP 167* 150* 139* 139* 136* 136*    Imaging   CXR: 8/1 cardiomegally, interstitial edema left subclavian vein CVL,  R breast implant  ASSESSMENT / PLAN:  Principal Problem:   Stroke Active Problems:   Hypertension  NEUROLOGIC A: Acute ischemic L MCA stroke, L carotid artery occlusion Cerebral edema with midline shift P:   Per neurology ASA 3% saline Repeat imaging, prognosis discussion per neurology  PULMONARY A: Aspiration risk Poor airway protection P:   Maintain NPO Aspiration risk SLP eval when more awake  CARDIOVASCULAR A: HTN post ischemic stroke P:   Per stroke team BP goal is < 220 for now, then reduce 5-7 days post stroke Labetalol prn  RENAL A: Theraputic hypernatremia Hypokalemia P:   Monitor BMET and UOP Replace electrolytes as needed  GASTROINTESTINAL A: Aspiration  risk P:   NPO SLP eval Tube feedings to begin per panda today  HEMATOLOGIC A: No acute issues P:   Monitor for bleeding  INFECTIOUS A: No active issues P:    ENDOCRINE A: Hyperglycemia P:   Goal Glucose < 180 in setting of acute stroke SSI   BEST PRACTICE / DISPOSITION Level of Care:  ICU Primary Service:  PCCM Consultants:  Neurology Code Status:  DNR/DNI Diet:  NPO DVT Px:  Heparin GI Px:  None Skin Integrity:  Intact Social / Family:  Updated today  TODAY'S SUMMARY: 80 y/o woman with large L MCA stroke, now DNR/DNI. Admitted to ICU for 3% saline.    Heber Dows, MD [place] PCCM Pager: [phone] Cell: [phone] After 3pm or if no response, call [phone]    07/19/2015, 12:58 PM

## 2015-07-19 NOTE — Progress Notes (Signed)
Physical Therapy Treatment Patient Details Name: Charlene Ellis MRN: [id number] DOB: August 30, 1935 Today's Date: 07/19/2015    History of Present Illness 80 y/o woman who presented 7/29 to OSH w/ complaints of R sided weakness and was diagnosed with TIA and sent home. On 7/30 her daughter found her non-verbal and completely limp on right side. She was found to have a large L MCA stroke with 4 mm midline shift, and was transferred to [facility].     PT Comments    Pt very fatigued/lethargic this afternoon. (Attempted to see x 2 earlier today and unable due to tests/procedures; noted pt was awake and restless each time). Did turn her head from Lt gaze preference to midline with her name called from her Rt. PROM Rt extremities completed and elevated for edema control and/or pressure relief. Slight incr tone/stiffness of Rt plantarflexors noted. Will monitor with ? Need for PRAFO.   Follow Up Recommendations  SNF (per notes, daughter requests SNF near home)     Equipment Recommendations  Other (comment) (to be determined)    Recommendations for Other Services       Precautions / Restrictions Precautions Precautions: Fall    Mobility  Bed Mobility                  Transfers                    Ambulation/Gait                 Stairs            Wheelchair Mobility    Modified Rankin (Stroke Patients Only) Modified Rankin (Stroke Patients Only) Pre-Morbid Rankin Score: No symptoms Modified Rankin: Severe disability     Balance                                    Cognition Arousal/Alertness: Lethargic Behavior During Therapy:  (drifting back to sleep when awakened) Overall Cognitive Status: Difficult to assess                      Exercises Other Exercises Other Exercises: PROM to RUE with pt arousing, turned head only to midline to sound of voice; assisted to rotate head to Rt for PROM of neck; reverts back to head  turned Left; RUE flaccid with no tone noted with PROM, elevated to decr edema; PROM bil ankles with increased tone/stiffness noted Rt ankle (may need PRAFO), although able to achieve full DF    General Comments        Pertinent Vitals/Pain Pain Assessment: Faces Faces Pain Scale: No hurt    Home Living                      Prior Function            PT Goals (current goals can now be found in the care plan section) Acute Rehab PT Goals Patient Stated Goal: Unable to state Time For Goal Achievement: 08/01/15 Progress towards PT goals: Not progressing toward goals - comment (fatigue/lethargy)    Frequency  Min 3X/week    PT Plan Discharge plan needs to be updated;Frequency needs to be updated    Co-evaluation             End of Session   Activity Tolerance: Patient limited by fatigue;Patient limited by lethargy (RN reports  awake this AM; just returned from CT; no sedation) Patient left: in bed;with call bell/phone within reach;with nursing/sitter in room     Time: 1448-1500 PT Time Calculation (min) (ACUTE ONLY): 12 min  Charges:  $Neuromuscular Re-education: 8-22 mins                    G Codes:      Charlene Ellis 08/13/2015, 3:11 PM Pager [phone]

## 2015-07-19 NOTE — Clinical Social Work Note (Signed)
Clinical Social Work Assessment  Patient Details  Name: Charlene Ellis MRN: [id number] Date of Birth: 17-Aug-1935  Date of referral:  07/18/15               Reason for consult:  Facility Placement                Permission sought to share information with:    Permission granted to share information::     Name::        Agency::     Relationship::     Contact Information:     Housing/Transportation Living arrangements for the past 2 months:  Single Family Home Source of Information:  Other (Comment Required) (daughter Charlene Ellis) Patient Interpreter Needed:  None Criminal Activity/Legal Involvement Pertinent to Current Situation/Hospitalization:  No - Comment as needed Significant Relationships:  Adult Children Lives with:  Adult Children Do you feel safe going back to the place where you live?  No Need for family participation in patient care:  Yes (Comment)  Care giving concerns:  Pt will need SNF placement at d/c due to limited ability of family to care for her.  Social Worker assessment / plan: Spoke briefly with pt's daughter, Charlene Ellis via phone.  CIR vs SNF placement discussed.  Daughter prefers SNF placement in [place] so that she will be close to her family.  Bed search process discussed and will be initiated, as appropriate.  CSW will continue to follow pt for d/c planning and facilitate NH tx as appropriate.  Employment status:  Retired Health and safety inspector:    PT Recommendations:  Inpatient Rehab Consult, Skilled Nursing Facility Information / Referral to community resources:  Skilled Nursing Facility  Patient/Family's Response to care:  Pt's daughter agreeable to plan. Patient/Family's Understanding of and Emotional Response to Diagnosis, Current Treatment, and Prognosis: Not assessed.  Daughter eating when CSW called.  Anxious for CSW to f/u with her at a later time.  Emotional Assessment Appearance:  Appears stated age Attitude/Demeanor/Rapport:  Unable to  Assess Affect (typically observed):  Unable to Assess Orientation:    Alcohol / Substance use:  Not Applicable Psych involvement (Current and /or in the community):  No (Comment)  Discharge Needs  Concerns to be addressed:  Discharge Planning Concerns Readmission within the last 30 days:  No Current discharge risk:  None Barriers to Discharge:  No Barriers Identified   Kaven Cumbie, Randol Kern, LCSW 07/19/2015, 10:29 PM

## 2015-07-19 DEATH — deceased

## 2015-07-20 ENCOUNTER — Inpatient Hospital Stay (HOSPITAL_COMMUNITY): Payer: PPO

## 2015-07-20 DIAGNOSIS — R509 Fever, unspecified: Secondary | ICD-10-CM

## 2015-07-20 DIAGNOSIS — I6522 Occlusion and stenosis of left carotid artery: Secondary | ICD-10-CM

## 2015-07-20 DIAGNOSIS — T17998A Other foreign object in respiratory tract, part unspecified causing other injury, initial encounter: Secondary | ICD-10-CM

## 2015-07-20 DIAGNOSIS — I4891 Unspecified atrial fibrillation: Secondary | ICD-10-CM

## 2015-07-20 LAB — BASIC METABOLIC PANEL
Anion gap: 9 (ref 5–15)
BUN: 30 mg/dL — ABNORMAL HIGH (ref 6–20)
CO2: 22 mmol/L (ref 22–32)
Calcium: 9.5 mg/dL (ref 8.9–10.3)
Chloride: 127 mmol/L — ABNORMAL HIGH (ref 101–111)
Creatinine, Ser: 0.85 mg/dL (ref 0.44–1.00)
GFR calc non Af Amer: >60 mL/min (ref >60)
GLUCOSE: 171 mg/dL — AB (ref 65–99)
Potassium: 2.9 mmol/L — ABNORMAL LOW (ref 3.5–5.1)
Sodium: 158 mmol/L — ABNORMAL HIGH (ref 135–145)

## 2015-07-20 LAB — GLUCOSE, CAPILLARY
GLUCOSE-CAPILLARY: 131 mg/dL — AB (ref 65–99)
GLUCOSE-CAPILLARY: 144 mg/dL — AB (ref 65–99)
GLUCOSE-CAPILLARY: 156 mg/dL — AB (ref 65–99)
Glucose-Capillary: 186 mg/dL — ABNORMAL HIGH (ref 65–99)
Glucose-Capillary: 135 mg/dL — ABNORMAL HIGH (ref 65–99)
Glucose-Capillary: 159 mg/dL — ABNORMAL HIGH (ref 65–99)

## 2015-07-20 LAB — PLATELET COUNT: Platelets: 193 10*3/uL (ref 150–400)

## 2015-07-20 LAB — SODIUM
Sodium: 158 mmol/L — ABNORMAL HIGH (ref 135–145)
Sodium: 157 mmol/L — ABNORMAL HIGH (ref 135–145)

## 2015-07-20 LAB — MAGNESIUM: MAGNESIUM: 2 mg/dL (ref 1.7–2.4)

## 2015-07-20 MED ORDER — METOPROLOL TARTRATE 1 MG/ML IV SOLN
5.0000 mg | Freq: Once | INTRAVENOUS | Status: AC
  Administered 2015-07-20: 5 mg via INTRAVENOUS

## 2015-07-20 MED ORDER — HYDRALAZINE HCL 20 MG/ML IJ SOLN
10.0000 mg | Freq: Once | INTRAMUSCULAR | Status: AC
  Administered 2015-07-20: 10 mg via INTRAVENOUS

## 2015-07-20 MED ORDER — DILTIAZEM HCL 100 MG IV SOLR
5.0000 mg/h | INTRAVENOUS | Status: DC
  Administered 2015-07-20: 5 mg/h via INTRAVENOUS
  Filled 2015-07-20: qty 100

## 2015-07-20 MED ORDER — AMIODARONE HCL IN DEXTROSE 360-4.14 MG/200ML-% IV SOLN
30.0000 mg/h | INTRAVENOUS | Status: DC
  Administered 2015-07-20 – 2015-07-21 (×2): 30 mg/h via INTRAVENOUS
  Filled 2015-07-20 (×6): qty 200

## 2015-07-20 MED ORDER — METOPROLOL TARTRATE 25 MG/10 ML ORAL SUSPENSION: 12.5000 mg | Freq: Two times a day (BID) | ORAL | Status: DC

## 2015-07-20 MED ORDER — DILTIAZEM LOAD VIA INFUSION
10.0000 mg | Freq: Once | INTRAVENOUS | Status: DC
  Filled 2015-07-20: qty 10

## 2015-07-20 MED ORDER — VITAL AF 1.2 CAL PO LIQD
1000.0000 mL | ORAL | Status: DC
  Administered 2015-07-20: 1000 mL
  Filled 2015-07-20 (×5): qty 1000

## 2015-07-20 MED ORDER — POTASSIUM CHLORIDE 10 MEQ/50ML IV SOLN
10.0000 meq | INTRAVENOUS | Status: AC
  Administered 2015-07-20 (×4): 10 meq via INTRAVENOUS
  Filled 2015-07-20 (×4): qty 50

## 2015-07-20 MED ORDER — METOPROLOL TARTRATE 1 MG/ML IV SOLN
2.5000 mg | INTRAVENOUS | Status: DC | PRN
  Administered 2015-07-20 – 2015-07-21 (×3): 5 mg via INTRAVENOUS
  Filled 2015-07-20 (×5): qty 5

## 2015-07-20 MED ORDER — AMIODARONE HCL IN DEXTROSE 360-4.14 MG/200ML-% IV SOLN
60.0000 mg/h | INTRAVENOUS | Status: AC
  Administered 2015-07-20 (×2): 60 mg/h via INTRAVENOUS
  Filled 2015-07-20 (×2): qty 200

## 2015-07-20 MED ORDER — POTASSIUM CHLORIDE 20 MEQ/15ML (10%) PO SOLN
60.0000 meq | Freq: Once | ORAL | Status: AC
  Administered 2015-07-20: 60 meq
  Filled 2015-07-20: qty 45

## 2015-07-20 MED ORDER — AMIODARONE LOAD VIA INFUSION
150.0000 mg | Freq: Once | INTRAVENOUS | Status: AC
  Administered 2015-07-20: 150 mg via INTRAVENOUS
  Filled 2015-07-20: qty 83.34

## 2015-07-20 NOTE — Progress Notes (Signed)
Speech Language Pathology Treatment: Dysphagia;Cognitive-Linquistic  Patient Details Name: Charlene Ellis MRN: [id number] DOB: [birth year] Today's Date: 07/20/2015 Time: 4098-1191 SLP Time Calculation (min) (ACUTE ONLY): 11 min  Assessment / Plan / Recommendation Clinical Impression  Pt has increased difficulty maintaining alertness today as compared to previous date. She does awaken with thermotactile stimulation and oral care, but falls back to sleep as soon as stimulation stops. While alert, pt continues to require Total A for one-step commands. Pt does not have oral acceptance of POs offered. Will continue to follow.   HPI Other Pertinent Information: 80 y/o woman who presented 7/29 to OSH w/ complaints of R sided weakness and was diagnosed with TIA and sent home. On 7/30 her daughter found her non-verbal and completely limp on right side. She was found to have a large L MCA stroke with 4 mm midline shift, and was transferred to [facility].    Pertinent Vitals Pain Assessment: Faces Faces Pain Scale: No hurt  SLP Plan  Continue with current plan of care    Recommendations Diet recommendations: NPO Medication Administration: Via alternative means       Oral Care Recommendations: Oral care QID Follow up Recommendations: Skilled Nursing facility (per family preference) Plan: Continue with current plan of care    Maxcine Ham, M.A. CCC-SLP [phone]  Maxcine Ham 07/20/2015, 11:27 AM

## 2015-07-20 NOTE — Progress Notes (Addendum)
STROKE TEAM PROGRESS NOTE   HISTORY Charlene Ellis is an 80 y.o. female without significant past medical history, brought in via EMS for evaluation of the above stated symptoms/signs. Patient was initially evaluated at [facility] ED who contacted our interventional neuroradiologist who recommended activating the code stroke and transferring patient to [facility] for possible endovascular intervention. Patient daughter is at the bedside and said that she last saw Charlene Ellis normal around 6 pm yesterday, then by 7 pm she noticed that she was poorly responsive, unable to communicate ,and not having normal mobility in her right side and thus she decided to seek medical attention. As per daughter, patient had CT brain and blood testing and released from [facility] with a diagnosis of " a minor stroke". However, she stresses the fact that her mother symptoms never resolved completely.  Then, this morning around 7 am she fell out the bed and was found unresponsive, unable to speak, not moving the right side. EMS was called and patient seen again at [facility] where she had a repeat CT brain that was unremarkable for acute abnormality but CUS revealed a left ICA occluded by a clot. NIHSS 25. CTA neck: the left common carotid artery is occluded 2.5 cm above the arch. The very distal left ICA terminus is opacified without contrast in the cavernous segment. CTA brain: large left MCA territory infarct. Dense left MCA occlusion without significant collaterals. High-grade stenosis of the non dominant proximal right vertebral artery. CTP demonstrated a large core infarct involving the majority of the left MCA territory with a smaller penumbra along the margin of the left temporal lobe.   Date last known well: 07/16/15 Time last known well: 6 pm tPA Given: no, out of the window NIHSS: 25 MRS: 0    SUBJECTIVE (INTERVAL HISTORY) No family is at the bedside. She is lethargic but able to arouse, On EKG, she has afib with  RVR. Repeat CT showed large MCA infarct with edema.    OBJECTIVE Temp:  [98.4 F (36.9 C)-100.2 F (37.9 C)] 98.6 F (37 C) (08/02 1525) Pulse Rate:  [50-161] 113 (08/02 1445) Cardiac Rhythm:  [-] Atrial fibrillation (08/02 0900) Resp:  [15-29] 19 (08/02 1445) BP: (116-197)/(40-140) 166/63 mmHg (08/02 1445) SpO2:  [94 %-100 %] 100 % (08/02 1445) Weight:  [147 lb 14.9 oz (67.1 kg)] 147 lb 14.9 oz (67.1 kg) (08/02 0600)   Recent Labs Lab 07/19/15 1953 07/19/15 2335 07/20/15 0346 07/20/15 1116 07/20/15 1523  GLUCAP 144* 139* 144* 135* 156*    Recent Labs Lab Jul 30, 2015 1501  07/18/15 0515  07/19/15 0440  07/19/15 1745 07/19/15 2002 07/19/15 2200 07/20/15 0640 07/20/15 0950  NA 138  < > 146*  < > 154*  < > 157* 159* 159* 158* 158*  K 3.7  --  3.2*  --  2.8*  --   --  3.3*  --  2.9*  --   CL 109  --  116*  --  124*  --   --  130*  --  127*  --   CO2 19*  --  24  --  20*  --   --  19*  --  22  --   GLUCOSE 146*  --  138*  --  153*  --   --  149*  --  171*  --   BUN 15  --  13  --  16  --   --  23*  --  30*  --  CREATININE 0.87  --  0.82  --  0.74  --   --  0.89  --  0.85  --   CALCIUM 9.0  --  8.9  --  9.4  --   --  9.8  --  9.5  --   MG  --   --   --   --   --   --   --   --   --   --  2.0  < > = values in this interval not displayed.  Recent Labs Lab 07/18/15 0515  AST 18  ALT 12*  ALKPHOS 79  BILITOT 0.8  PROT 5.7*  ALBUMIN 3.0*    Recent Labs Lab 07-29-15 1501 07/18/15 0515 07/19/15 0440 07/20/15 0950  WBC 9.8 10.2 13.9*  --   HGB 11.7* 10.2* 10.7*  --   HCT 34.5* 30.3* 32.0*  --   MCV 86.3 86.3 87.0  --   PLT PLATELET CLUMPS NOTED ON SMEAR, COUNT APPEARS DECREASED PLATELET CLUMPING, SUGGEST RECOLLECTION OF SAMPLE IN CITRATE TUBE. PLATELET CLUMPING, SUGGEST RECOLLECTION OF SAMPLE IN CITRATE TUBE. 193    Recent Labs Lab 07/18/15 1050 07/18/15 1712 07/18/15 2215  CKTOTAL 113  --   --   CKMB 2.2  --   --   TROPONINI 0.14* 0.27* 0.34*   No  results for input(s): LABPROT, INR in the last 72 hours. No results for input(s): COLORURINE, LABSPEC, PHURINE, GLUCOSEU, HGBUR, BILIRUBINUR, KETONESUR, PROTEINUR, UROBILINOGEN, NITRITE, LEUKOCYTESUR in the last 72 hours.  Invalid input(s): APPERANCEUR     Component Value Date/Time   CHOL 264* 07/18/2015 0445   TRIG 79 07/18/2015 0445   HDL 67 07/18/2015 0445   CHOLHDL 3.9 07/18/2015 0445   VLDL 16 07/18/2015 0445   LDLCALC 181* 07/18/2015 0445   Lab Results  Component Value Date   HGBA1C 5.7* 07/18/2015   No results found for: LABOPIA, COCAINSCRNUR, LABBENZ, AMPHETMU, THCU, LABBARB  No results for input(s): ETH in the last 168 hours.    Imaging  Ct Angio Head and Neck W/cm &/or Wo Cm 2015/07/29    1. Large left MCA territory infarct.  2. The left common carotid artery is occluded 2.5 cm above the arch.  3. The very distal left ICA terminus is opacified without contrast in the cavernous segment.  4. Dense left MCA occlusion without significant collaterals.  5. Atherosclerotic calcifications at the right carotid bifurcation and cavernous internal carotid artery without significant stenosis.  6. High-grade stenosis of the non dominant proximal right vertebral artery.  7. Moderate distal small vessel disease within MCA branches on the right, bilateral ACA branches, and posterior cerebral arteries, worse on the right.  8. 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  9. Bilateral pleural effusions, right greater than left.  10. Pulmonary edema is also worse on the right.   CT head 07/19/15 Subacute infarct left MCA territory with edema and mass-effect. No hemorrhage. Slight increase in midline shift since the prior study.  Mr Brain Wo Contrast 07-29-15    1. Large left MCA acute nonhemorrhagic infarct with mass effect and 4 mm midline shift.  2. Sparing of the left temporal lobe.  3. Left ICA and MCA occlusion.  4.  Extensive atrophy and white matter disease compatible with chronic underlying microvascular ischemia.     Ct Cerebral Perfusion W/cm 07-29-2015    1. Large core infarct involving the majority of the left MCA territory. A smaller penumbra  is seen along the left temporal lobe.  2. The infarct spares the left caudate head and anterior lentiform nucleus.   Dg Chest Port 1 View 08-01-2015    1. Interval placement of a left subclavian approach central venous catheter with tip overlying the mid right atrium.  2. Similar appearance of diffuse pulmonary interstitial edema as compared to prior radiograph performed earlier the same day.     2D echo - Left ventricle: The cavity size was normal. Wall thickness was increased in a pattern of mild LVH. Systolic function was normal. The estimated ejection fraction was in the range of 60% to 65%. Features are consistent with a pseudonormal left ventricular filling pattern, with concomitant abnormal relaxation and increased filling pressure (grade 2 diastolic dysfunction). - Mitral valve: There was mild regurgitation. - Left atrium: The atrium was mildly dilated. - Tricuspid valve: There was moderate regurgitation. - Pulmonary arteries: PA peak pressure: 58 mm Hg (S).  PHYSICAL EXAM   Temp:  [98.4 F (36.9 C)-100.2 F (37.9 C)] 98.6 F (37 C) (08/02 1525) Pulse Rate:  [50-161] 113 (08/02 1445) Resp:  [15-29] 19 (08/02 1445) BP: (116-197)/(40-140) 166/63 mmHg (08/02 1445) SpO2:  [94 %-100 %] 100 % (08/02 1445) Weight:  [147 lb 14.9 oz (67.1 kg)] 147 lb 14.9 oz (67.1 kg) (08/02 0600)  General - Well nourished, well developed, mild agitation, global aphasia.  Ophthalmologic - Fundi not visualized due to noncooperation.  Cardiovascular - Regular rate and rhythm with no murmur.  Neuro - lethargic, eyes open on voice and touch stimulation, not following commands, global aphasia. Right neglect, not blinking to visual threat on the right,  PERRL, right facial droop, LUE and LLE spontaneous movement, RUE 0/5, RLE 1/5 on pain stimulation, RUE and RLE increased muscle tone and positive babinski. Sensation and gait not tested.   ASSESSMENT Charlene Ellis is a 80 y.o. female with history of obstructive sleep apnea presenting with decreased responsiveness, inability to communicate, and right-sided weakness.. She did not receive IV t-PA due to late presentation  Stroke:  Dominant left MCA infarct probably embolic secondary to occlusion of the left common carotid artery.  Resultant  Right side hemiplegia and global aphasia  MRI  Large left MCA acute infarct with mass effect with midline shift.   CTA head and neck - left common carotid artery occlusion. Left MCA occlusion. High-grade stenosis right VA.  Repeat CT showed enlargement of previous infarct.  2D Echo EF 60-65%  LDL 181  HgbA1c 5.7  Subcutaneous heparin for VTE prophylaxis Diet NPO time specified  aspirin 325 mg orally every day prior to admission, now on aspirin 325 mg daily  Ongoing aggressive stroke risk factor management  Therapy recommendations:  Pending  Disposition: Pending  Cerebral edema  Off 3% saline now due to high Na  Na 159->158  Na goal 150-160  Na Q6h  Close monitoring  Afib with RVR  New diagnosis  Pt not candidate for anticoagulation now due to large infarct  CCM managing  Consider cardizem drip vs. Amiodarone drip  Hypertension  Home meds:  Losartan 100 mg daily  Stable  BP goal 120-140 in 5-7 days  Hyperlipidemia  Home meds:  Mevacor 10 mg daily - NPO  LDL 181, goal < 70  Add Lipitor 40 mg daily per tube  Continue statin at discharge  Other Stroke Risk Factors  Advanced age  Obstructive sleep apnea, on CPAP at home  Tube feeding now, did not pass swallow.  Other  Pertinent History  DNR   leukocytosis  Hospital day # 3   This patient is critically ill due to large left MCA infarct and at  significant risk of neurological worsening, death form cerebral edema and brain herniation.. This patient's care requires constant monitoring of vital signs, hemodynamics, respiratory and cardiac monitoring, review of multiple databases, neurological assessment, discussion with family, other specialists and medical decision making of high complexity. I spent 40 minutes of neurocritical care time in the care of this patient.   Marvel Plan, MD PhD Stroke Neurology 07/20/2015 4:21 PM     To contact Stroke Continuity provider, please refer to [email or web address]. After hours, contact General Neurology

## 2015-07-20 NOTE — Progress Notes (Signed)
eLink Physician-Brief Progress Note Patient Name: Charlene Ellis DOB: 1935/01/06 MRN: [id number]   Date of Service  07/20/2015  HPI/Events of Note  Patient with persistent afib, noted to have SBP in the 170s  eICU Interventions  Lopressor  IV x 1     Intervention Category Intermediate Interventions: Hypertension - evaluation and management  Valta Dillon 07/20/2015, 6:20 PM

## 2015-07-20 NOTE — Progress Notes (Signed)
CSW provided bed offers to pt daughter in law Oleta Mouse ( [phone]) who is in charge of searching for SNF.  Family chooses [facility] when pt is medically stable  CSW will continue to follow.  Merlyn Lot, LCSWA Clinical Social Worker [phone]

## 2015-07-20 NOTE — Progress Notes (Signed)
PULMONARY / CRITICAL CARE MEDICINE HISTORY AND PHYSICAL EXAMINATION   Name: Charlene Ellis MRN: [id number] DOB: 1935-05-16    ADMISSION DATE:  07-24-15  PRIMARY SERVICE: PCCM  CHIEF COMPLAINT:  Nonverbal  BRIEF PATIENT DESCRIPTION: 80 y/o woman with large L MCA stroke with late presentation  SIGNIFICANT EVENTS / STUDIES:  CTA brain 7/30 > dense left MCA occlusion, left common carotid artery occlusion CT brain perfusion scan 7/30 > large core infarct of majority of left MCA MR Brain 7/30: Large left MCA acute nonhemorrhagic infarct with mass effect and 4 mm midline shift. CT head 8/1 > L MCA stroke, worsening midline shift 8/2 Afib with RVR  LINES / TUBES: - PIV - Foley 7/30 - L Tamora CVC 7/30  CULTURES: MRSA screen (+)  ANTIBIOTICS: None   SUBJECTIVE: CT performed yesterday, afib with rVR this morning  VITAL SIGNS: Temp:  [98.4 F (36.9 C)-100.2 F (37.9 C)] 100.2 F (37.9 C) (08/02 0738) Pulse Rate:  [54-102] 64 (08/02 0600) Resp:  [15-31] 23 (08/02 0600) BP: (155-197)/(40-140) 161/53 mmHg (08/02 0600) SpO2:  [94 %-99 %] 96 % (08/02 0600) Weight:  [67.1 kg (147 lb 14.9 oz)-67.3 kg (148 lb 5.9 oz)] 67.1 kg (147 lb 14.9 oz) (08/02 0600) HEMODYNAMICS:   VENTILATOR SETTINGS:   INTAKE / OUTPUT: Intake/Output      08/01 0701 - 08/02 0700 08/02 0701 - 08/03 0700   I.V. (mL/kg) 372.5 (5.6)    NG/GT 337    IV Piggyback 200    Total Intake(mL/kg) 909.5 (13.6)    Urine (mL/kg/hr) 1020 (0.6)    Stool     Total Output 1020     Net -110.5            PHYSICAL EXAMINATION:  Gen: lying in bed, no distress HENT: scar over left eye PULM: Crackles in bases CV: RRR, no mgr GI: BS+, soft, nontender Ext: trace edema, SCD Neuro: moves left arm and leg spontaneously but not too command  LABS:  CBC  Recent Labs Lab 2015/07/24 1501 07/18/15 0515 07/19/15 0440  WBC 9.8 10.2 13.9*  HGB 11.7* 10.2* 10.7*  HCT 34.5* 30.3* 32.0*  PLT PLATELET CLUMPS NOTED ON SMEAR, COUNT  APPEARS DECREASED PLATELET CLUMPING, SUGGEST RECOLLECTION OF SAMPLE IN CITRATE TUBE. PLATELET CLUMPING, SUGGEST RECOLLECTION OF SAMPLE IN CITRATE TUBE.   Coag's  Recent Labs Lab 2015/07/24 1501  INR 1.12   BMET  Recent Labs Lab 07/19/15 0440  07/19/15 2002 07/19/15 2200 07/20/15 0640  NA 154*  < > 159* 159* 158*  K 2.8*  --  3.3*  --  2.9*  CL 124*  --  130*  --  127*  CO2 20*  --  19*  --  22  BUN 16  --  23*  --  30*  CREATININE 0.74  --  0.89  --  0.85  GLUCOSE 153*  --  149*  --  171*  < > = values in this interval not displayed. Electrolytes  Recent Labs Lab 07/19/15 0440 07/19/15 2002 07/20/15 0640  CALCIUM 9.4 9.8 9.5   Sepsis Markers  Recent Labs Lab 07-24-15 1510  LATICACIDVEN 1.58   ABG No results for input(s): PHART, PCO2ART, PO2ART in the last 168 hours. Liver Enzymes  Recent Labs Lab 07/18/15 0515  AST 18  ALT 12*  ALKPHOS 79  BILITOT 0.8  ALBUMIN 3.0*   Cardiac Enzymes  Recent Labs Lab 07/18/15 1050 07/18/15 1712 07/18/15 2215  TROPONINI 0.14* 0.27* 0.34*   Glucose  Recent Labs Lab 07/19/15 0758 07/19/15 1119 07/19/15 1544 07/19/15 1953 07/19/15 2335 07/20/15 0346  GLUCAP 136* 136* 138* 144* 139* 144*    Imaging   CXR: 8/1 cardiomegally, interstitial edema left subclavian vein CVL,  R breast implant  ASSESSMENT / PLAN:  Principal Problem:   Stroke Active Problems:   Hypertension   Acute ischemic left MCA stroke   Stroke with cerebral ischemia   HLD (hyperlipidemia)  NEUROLOGIC A: Acute ischemic L MCA stroke, L carotid artery occlusion Cerebral edema with midline shift > worse on 8/2 P:   Per neurology ASA 3% saline per neurology to continue, goal Na 155-160 Repeat imaging, prognosis discussion per neurology  PULMONARY A: Aspiration risk Poor airway protection P:   Maintain NPO Aspiration risk SLP eval when more awake  CARDIOVASCULAR A: HTN post ischemic stroke Afib with RVR > acute onset 8/2  AM despite metoprolol scheduled> cannot anticoagulate right now given large MCA stroke P:   Per stroke team BP goal is < 180 for now, will lower goal in 3-4 days stroke Hydralazine prn D/c schedule metoprolol Start diltiazem gtt, monitor BP carefully Monitor  RENAL A: Theraputic hypernatremia Hypokalemia P:   Monitor BMET and UOP Replace electrolytes as needed  GASTROINTESTINAL A: Aspiration risk P:   NPO SLP eval Tube feedings to begin per panda today  HEMATOLOGIC A: Platelet clumping,  P:   Monitor for bleeding  INFECTIOUS A: Low grade temp, WBC, no sign of infection on exam P:   Monitor off of antibiotics for now Check CXR to look for pneumonia, may need antibiotics  ENDOCRINE A: Hyperglycemia P:   Goal Glucose < 180 in setting of acute stroke SSI   BEST PRACTICE / DISPOSITION Level of Care:  ICU Primary Service:  PCCM Consultants:  Neurology Code Status:  DNR/DNI Diet:  NPO DVT Px:  Heparin GI Px:  None Skin Integrity:  Intact Social / Family:  Updated today  TODAY'S SUMMARY: 80 y/o woman with large L MCA stroke, DNR/DNI. Admitted to ICU for 3% saline.  CT 8/1 shows worsening edema.  Aspiration risk.    CC time today due her critically ill condition with multiple organ failures, high risk medications is 35 minutes  My cc time is 40 minutes  Heber McHenry, MD [facility] Pager: [phone] Cell: [phone] After 3pm or if no response, call [phone]    07/20/2015, 9:06 AM

## 2015-07-20 NOTE — Progress Notes (Signed)
eLink Physician-Brief Progress Note Patient Name: Charlene Ellis DOB: 1935-08-12 MRN: [id number]   Date of Service  07/20/2015  HPI/Events of Note  Pt with CVA and concern for aspiration.    She has A fib with RVR.  She has received lopressor, cardizem, and amiodarone >> remains with elevated HR.  Appears to have increased WOB.  She is DNR/DNI.  He K this AM was 2.9.   eICU Interventions  Will try on BiPAP.  Continue amiodarone.  Repeat BMET and replace KCL as needed.      Intervention Category Major Interventions: Other:  Charlene Ellis 07/20/2015, 11:44 PM

## 2015-07-20 NOTE — Progress Notes (Signed)
Notified MD Reynolds that patients BP is 183/71. Orders received to give Hydralazine  now. Will continue to monitor.

## 2015-07-20 NOTE — Progress Notes (Signed)
Initial Nutrition Assessment  DOCUMENTATION CODES:   Not applicable  INTERVENTION:    D/C Vital High Protein   Initiate Vital AF 1.2 formula at 25 ml/hr and increase by 10 ml every 4 hours to goal rate of 55 ml/hr to provide 1584 kcals, 99 gm protein, 1071 ml of free water  NUTRITION DIAGNOSIS:   Inadequate oral intake related to inability to eat as evidenced by NPO status  GOAL:   Patient will meet greater than or equal to 90% of their needs  MONITOR:   TF tolerance, Diet advancement, Labs, Weight trends, I & O's  REASON FOR ASSESSMENT:   Consult Enteral/tube feeding initiation and management  ASSESSMENT:   80 y/o woman with no significant PMH; presented with large L MCA stroke.  Pt nonverbal.  S/p BSE 7/31 -- SLP recommending NPO status.  Vital High Protein formula initiated 8/1.  Currently infusing at 40 ml/hr via Panda tube (tip in distal body of stomach) providing 960 kcals, 84 gm protein, 803 ml of free water.  RD unable to complete Nutrition Focused Physical Exam at this time.  Diet Order:  Diet NPO time specified  Skin:  Reviewed, no issues  Last BM:  8/1  Height:   Ht Readings from Last 1 Encounters:  2015-07-27  (1.626 m)    Weight:   Wt Readings from Last 1 Encounters:  07/20/15 147 lb 14.9 oz (67.1 kg)    Ideal Body Weight:  54.5 kg  BMI:  Body mass index is 25.38 kg/(m^2).  Estimated Nutritional Needs:   Kcal:  1700-1900  Protein:  95-105 gm  Fluid:   1.7-1.9 L  EDUCATION NEEDS:   No education needs identified at this time  Maureen Chatters, RD, LDN Pager #: [phone] After-Hours Pager #: [phone]

## 2015-07-21 ENCOUNTER — Inpatient Hospital Stay (HOSPITAL_COMMUNITY): Payer: PPO

## 2015-07-21 LAB — BASIC METABOLIC PANEL
ANION GAP: 7 (ref 5–15)
ANION GAP: 9 (ref 5–15)
BUN: 29 mg/dL — AB (ref 6–20)
BUN: 32 mg/dL — AB (ref 6–20)
CALCIUM: 9 mg/dL (ref 8.9–10.3)
CALCIUM: 9.4 mg/dL (ref 8.9–10.3)
CHLORIDE: 125 mmol/L — AB (ref 101–111)
CO2: 23 mmol/L (ref 22–32)
CO2: 23 mmol/L (ref 22–32)
Chloride: 126 mmol/L — ABNORMAL HIGH (ref 101–111)
Creatinine, Ser: 0.73 mg/dL (ref 0.44–1.00)
Creatinine, Ser: 0.82 mg/dL (ref 0.44–1.00)
GFR calc Af Amer: >60 mL/min (ref >60)
GFR calc non Af Amer: >60 mL/min (ref >60)
GLUCOSE: 186 mg/dL — AB (ref 65–99)
Glucose, Bld: 145 mg/dL — ABNORMAL HIGH (ref 65–99)
Potassium: 2.9 mmol/L — ABNORMAL LOW (ref 3.5–5.1)
Potassium: 3 mmol/L — ABNORMAL LOW (ref 3.5–5.1)
SODIUM: 156 mmol/L — AB (ref 135–145)
Sodium: 157 mmol/L — ABNORMAL HIGH (ref 135–145)

## 2015-07-21 LAB — GLUCOSE, CAPILLARY
GLUCOSE-CAPILLARY: 179 mg/dL — AB (ref 65–99)
Glucose-Capillary: 146 mg/dL — ABNORMAL HIGH (ref 65–99)
Glucose-Capillary: 162 mg/dL — ABNORMAL HIGH (ref 65–99)

## 2015-07-21 LAB — SODIUM
Sodium: 158 mmol/L — ABNORMAL HIGH (ref 135–145)
Sodium: 155 mmol/L — ABNORMAL HIGH (ref 135–145)

## 2015-07-21 MED ORDER — POTASSIUM CHLORIDE 10 MEQ/50ML IV SOLN
10.0000 meq | INTRAVENOUS | Status: AC
  Administered 2015-07-21 (×3): 10 meq via INTRAVENOUS
  Filled 2015-07-21 (×3): qty 50

## 2015-07-21 MED ORDER — DILTIAZEM LOAD VIA INFUSION
15.0000 mg | Freq: Once | INTRAVENOUS | Status: AC
  Administered 2015-07-21: 15 mg via INTRAVENOUS
  Filled 2015-07-21: qty 15

## 2015-07-21 MED ORDER — PIPERACILLIN-TAZOBACTAM 3.375 G IVPB
3.3750 g | Freq: Three times a day (TID) | INTRAVENOUS | Status: DC
  Administered 2015-07-21: 3.375 g via INTRAVENOUS
  Filled 2015-07-21 (×3): qty 50

## 2015-07-21 MED ORDER — DILTIAZEM HCL 100 MG IV SOLR
5.0000 mg/h | INTRAVENOUS | Status: DC
  Administered 2015-07-21: 5 mg/h via INTRAVENOUS
  Administered 2015-07-21: 15 mg/h via INTRAVENOUS
  Filled 2015-07-21 (×2): qty 100

## 2015-07-21 MED ORDER — METOPROLOL TARTRATE 1 MG/ML IV SOLN
5.0000 mg | Freq: Once | INTRAVENOUS | Status: AC
  Administered 2015-07-21: 5 mg via INTRAVENOUS

## 2015-07-21 MED ORDER — MAGNESIUM SULFATE IN D5W 10-5 MG/ML-% IV SOLN
1.0000 g | Freq: Once | INTRAVENOUS | Status: AC
  Administered 2015-07-21: 1 g via INTRAVENOUS
  Filled 2015-07-21: qty 100

## 2015-07-21 MED ORDER — FUROSEMIDE 10 MG/ML IJ SOLN
40.0000 mg | Freq: Once | INTRAMUSCULAR | Status: AC
  Administered 2015-07-21: 40 mg via INTRAVENOUS
  Filled 2015-07-21: qty 4

## 2015-07-21 MED ORDER — MORPHINE BOLUS VIA INFUSION
5.0000 mg | INTRAVENOUS | Status: DC | PRN
  Filled 2015-07-21: qty 20

## 2015-07-21 MED ORDER — VANCOMYCIN HCL IN DEXTROSE 1-5 GM/200ML-% IV SOLN
1000.0000 mg | Freq: Once | INTRAVENOUS | Status: AC
  Administered 2015-07-21: 1000 mg via INTRAVENOUS
  Filled 2015-07-21: qty 200

## 2015-07-21 MED ORDER — ACETAMINOPHEN 160 MG/5ML PO SOLN
650.0000 mg | Freq: Four times a day (QID) | ORAL | Status: DC | PRN
  Filled 2015-07-21: qty 20.3

## 2015-07-21 MED ORDER — MORPHINE SULFATE 25 MG/ML IV SOLN
10.0000 mg/h | INTRAVENOUS | Status: DC
  Administered 2015-07-21: 10 mg/h via INTRAVENOUS
  Filled 2015-07-21 (×2): qty 10

## 2015-07-21 MED ORDER — POTASSIUM CHLORIDE 20 MEQ/15ML (10%) PO SOLN
40.0000 meq | Freq: Once | ORAL | Status: AC
  Administered 2015-07-21: 40 meq
  Filled 2015-07-21: qty 30

## 2015-07-21 MED ORDER — VANCOMYCIN HCL 500 MG IV SOLR
500.0000 mg | Freq: Two times a day (BID) | INTRAVENOUS | Status: DC
  Filled 2015-07-21: qty 500

## 2015-07-21 NOTE — Progress Notes (Signed)
Notified MD Sood of potassium of 2.9. Awaiting orders

## 2015-07-21 NOTE — Progress Notes (Signed)
STROKE TEAM PROGRESS NOTE   HISTORY Charlene Ellis is an 80 y.o. female without significant past medical history, brought in via EMS for evaluation of the above stated symptoms/signs. Patient was initially evaluated at [facility] ED who contacted our interventional neuroradiologist who recommended activating the code stroke and transferring patient to [facility] for possible endovascular intervention. Patient daughter is at the bedside and said that she last saw Charlene Ellis normal around 6 pm yesterday, then by 7 pm she noticed that she was poorly responsive, unable to communicate ,and not having normal mobility in her right side and thus she decided to seek medical attention. As per daughter, patient had CT brain and blood testing and released from [facility] with a diagnosis of " a minor stroke". However, she stresses the fact that her mother symptoms never resolved completely.  Then, this morning around 7 am she fell out the bed and was found unresponsive, unable to speak, not moving the right side. EMS was called and patient seen again at [facility] where she had a repeat CT brain that was unremarkable for acute abnormality but CUS revealed a left ICA occluded by a clot. NIHSS 25. CTA neck: the left common carotid artery is occluded 2.5 cm above the arch. The very distal left ICA terminus is opacified without contrast in the cavernous segment. CTA brain: large left MCA territory infarct. Dense left MCA occlusion without significant collaterals. High-grade stenosis of the non dominant proximal right vertebral artery. CTP demonstrated a large core infarct involving the majority of the left MCA territory with a smaller penumbra along the margin of the left temporal lobe.   Date last known well: 07/16/15 Time last known well: 6 pm tPA Given: no, out of the window NIHSS: 25 MRS: 0    SUBJECTIVE (INTERVAL HISTORY) No family is at the bedside. She is lethargic and on biPAP due to respiratory distress, still  has A. fib with RVR, on amiodarone and Cardizem drip. She developed fever overnight. Condition worsened compared to yesterday, needed family meeting to clarify goal of care.    OBJECTIVE Temp:  [97.8 F (36.6 C)-100.9 F (38.3 C)] 98.8 F (37.1 C) (08/03 1158) Pulse Rate:  [37-197] 63 (08/03 1600) Cardiac Rhythm:  [-] Atrial fibrillation (08/03 0800) Resp:  [0-46] 25 (08/03 1600) BP: (120-194)/(48-106) 143/59 mmHg (08/03 1600) SpO2:  [98 %-100 %] 100 % (08/03 1600) FiO2 (%):  [40 %] 40 % (08/03 1500) Weight:  [146 lb 2.6 oz (66.3 kg)] 146 lb 2.6 oz (66.3 kg) (08/03 0500)   Recent Labs Lab 07/20/15 2004 07/20/15 2351 07/21/15 0321 07/21/15 0815 07/21/15 1156  GLUCAP 159* 131* 146* 179* 162*    Recent Labs Lab 07/19/15 0440  07/19/15 2002  07/20/15 0640 07/20/15 0950 07/20/15 1645 07/20/15 2358 07/21/15 0500 07/21/15 1118 07/21/15 1130  NA 154*  < > 159*  < > 158* 158* 157* 157* 158* 156* 155*  K 2.8*  --  3.3*  --  2.9*  --   --  2.9*  --  3.0*  --   CL 124*  --  130*  --  127*  --   --  125*  --  126*  --   CO2 20*  --  19*  --  22  --   --  23  --  23  --   GLUCOSE 153*  --  149*  --  171*  --   --  145*  --  186*  --  BUN 16  --  23*  --  30*  --   --  29*  --  32*  --   CREATININE 0.74  --  0.89  --  0.85  --   --  0.73  --  0.82  --   CALCIUM 9.4  --  9.8  --  9.5  --   --  9.4  --  9.0  --   MG  --   --   --   --   --  2.0  --   --   --   --   --   < > = values in this interval not displayed.  Recent Labs Lab 07/18/15 0515  AST 18  ALT 12*  ALKPHOS 79  BILITOT 0.8  PROT 5.7*  ALBUMIN 3.0*    Recent Labs Lab 2015/08/08 1501 07/18/15 0515 07/19/15 0440 07/20/15 0950  WBC 9.8 10.2 13.9*  --   HGB 11.7* 10.2* 10.7*  --   HCT 34.5* 30.3* 32.0*  --   MCV 86.3 86.3 87.0  --   PLT PLATELET CLUMPS NOTED ON SMEAR, COUNT APPEARS DECREASED PLATELET CLUMPING, SUGGEST RECOLLECTION OF SAMPLE IN CITRATE TUBE. PLATELET CLUMPING, SUGGEST RECOLLECTION OF SAMPLE  IN CITRATE TUBE. 193    Recent Labs Lab 07/18/15 1050 07/18/15 1712 07/18/15 2215  CKTOTAL 113  --   --   CKMB 2.2  --   --   TROPONINI 0.14* 0.27* 0.34*   No results for input(s): LABPROT, INR in the last 72 hours. No results for input(s): COLORURINE, LABSPEC, PHURINE, GLUCOSEU, HGBUR, BILIRUBINUR, KETONESUR, PROTEINUR, UROBILINOGEN, NITRITE, LEUKOCYTESUR in the last 72 hours.  Invalid input(s): APPERANCEUR     Component Value Date/Time   CHOL 264* 07/18/2015 0445   TRIG 79 07/18/2015 0445   HDL 67 07/18/2015 0445   CHOLHDL 3.9 07/18/2015 0445   VLDL 16 07/18/2015 0445   LDLCALC 181* 07/18/2015 0445   Lab Results  Component Value Date   HGBA1C 5.7* 07/18/2015   No results found for: LABOPIA, COCAINSCRNUR, LABBENZ, AMPHETMU, THCU, LABBARB  No results for input(s): ETH in the last 168 hours.    Imaging  Ct Angio Head and Neck W/cm &/or Wo Cm 2015/08/08    1. Large left MCA territory infarct.  2. The left common carotid artery is occluded 2.5 cm above the arch.  3. The very distal left ICA terminus is opacified without contrast in the cavernous segment.  4. Dense left MCA occlusion without significant collaterals.  5. Atherosclerotic calcifications at the right carotid bifurcation and cavernous internal carotid artery without significant stenosis.  6. High-grade stenosis of the non dominant proximal right vertebral artery.  7. Moderate distal small vessel disease within MCA branches on the right, bilateral ACA branches, and posterior cerebral arteries, worse on the right.  8. 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  9. Bilateral pleural effusions, right greater than left.  10. Pulmonary edema is also worse on the right.   CT head 07/19/15 Subacute infarct left MCA territory with edema and mass-effect. No hemorrhage. Slight increase in midline shift since the prior study.  Mr Brain Wo Contrast 08-08-2015     1. Large left MCA acute nonhemorrhagic infarct with mass effect and 4 mm midline shift.  2. Sparing of the left temporal lobe.  3. Left ICA and MCA occlusion.  4. Extensive atrophy and white matter disease compatible with chronic underlying microvascular ischemia.  Ct Cerebral Perfusion W/cm 07/18/2015    1. Large core infarct involving the majority of the left MCA territory. A smaller penumbra is seen along the left temporal lobe.  2. The infarct spares the left caudate head and anterior lentiform nucleus.   Dg Chest Port 1 View 07/11/2015    1. Interval placement of a left subclavian approach central venous catheter with tip overlying the mid right atrium.  2. Similar appearance of diffuse pulmonary interstitial edema as compared to prior radiograph performed earlier the same day.     2D echo - Left ventricle: The cavity size was normal. Wall thickness was increased in a pattern of mild LVH. Systolic function was normal. The estimated ejection fraction was in the range of 60% to 65%. Features are consistent with a pseudonormal left ventricular filling pattern, with concomitant abnormal relaxation and increased filling pressure (grade 2 diastolic dysfunction). - Mitral valve: There was mild regurgitation. - Left atrium: The atrium was mildly dilated. - Tricuspid valve: There was moderate regurgitation. - Pulmonary arteries: PA peak pressure: 58 mm Hg (S).  PHYSICAL EXAM   Temp:  [97.8 F (36.6 C)-100.9 F (38.3 C)] 98.8 F (37.1 C) (08/03 1158) Pulse Rate:  [37-197] 63 (08/03 1600) Resp:  [0-46] 25 (08/03 1600) BP: (120-194)/(48-106) 143/59 mmHg (08/03 1600) SpO2:  [98 %-100 %] 100 % (08/03 1600) FiO2 (%):  [40 %] 40 % (08/03 1500) Weight:  [146 lb 2.6 oz (66.3 kg)] 146 lb 2.6 oz (66.3 kg) (08/03 0500)  General - Well nourished, well developed, mild agitation, global aphasia.  Ophthalmologic - Fundi not visualized due to noncooperation.  Cardiovascular -  Regular rate and rhythm with no murmur.  Neuro - lethargic, on biPAP, eyes open on voice and touch stimulation, not following commands, global aphasia. Right neglect, not blinking to visual threat on the right, PERRL, right facial droop, LUE and LLE spontaneous movement, RUE 0/5, RLE 1/5 on pain stimulation, RUE and RLE increased muscle tone and positive babinski. Sensation and gait not tested.   ASSESSMENT Charlene Ellis is a 80 y.o. female with history of obstructive sleep apnea presenting with decreased responsiveness, inability to communicate, and right-sided weakness.. She did not receive IV t-PA due to late presentation  Stroke:  Dominant left MCA infarct probably embolic secondary to occlusion of the left common carotid artery.  Resultant  Right side hemiplegia and global aphasia  MRI  Large left MCA acute infarct with mass effect with midline shift.   CTA head and neck - left common carotid artery occlusion. Left MCA occlusion. High-grade stenosis right VA.  Repeat CT showed enlargement of previous infarct.  2D Echo EF 60-65%  LDL 181  HgbA1c 5.7  Subcutaneous heparin for VTE prophylaxis    aspirin 325 mg orally every day prior to admission, now on aspirin 325 mg daily  Ongoing aggressive stroke risk factor management  Cerebral edema  Off 3% saline now due to high Na  Na 159->158->155  Na goal 150-160  Na Q6h  Close monitoring  Afib with RVR  New diagnosis  Pt not candidate for anticoagulation now due to large infarct  CCM managing  On cardizem drip and Amiodarone drip  With respiratory distress on BiPAP  Hypertension  Home meds:  Losartan 100 mg daily  Stable  BP goal 120-140 in 5-7 days  Hyperlipidemia  Home meds:  Mevacor 10 mg daily - NPO  LDL 181, goal < 70  Add Lipitor 40 mg daily per tube  Continue statin at discharge  Other Stroke Risk Factors  Advanced age  Obstructive sleep apnea, on CPAP at home  Tube feeding now, did  not pass swallow.  Other Pertinent History  DNR   Leukocytosis  Need family meeting for goals of care.  Hospital day # 4   This patient is critically ill due to large left MCA infarct, respiratory distress on BiPAP, A. fib RVR, fever with pneumonia and at significant risk of neurological worsening, death form cerebral edema and brain herniation, respiratory failure, heart failure and sepsis.. This patient's care requires constant monitoring of vital signs, hemodynamics, respiratory and cardiac monitoring, review of multiple databases, neurological assessment, discussion with family, other specialists and medical decision making of high complexity. I spent 45 minutes of neurocritical care time in the care of this patient.  ADDENDUM:  Had a family meeting at 3 PM, discussed in length with patient and daughter, son and other family members regarding patient current condition, diagnosis, treatment plan, and prognosis. Patient condition progressed, and likely need intubation. However, patient neurological prognosis is poor, very limited quality of life. Patient family is deciding on aggressive treatment with intubation versus comfort care.  Marvel Plan, MD PhD Stroke Neurology 07/21/2015 5:24 PM     To contact Stroke Continuity provider, please refer to [email or web address]. After hours, contact General Neurology

## 2015-07-21 NOTE — Progress Notes (Signed)
PT Cancellation Note  Patient Details Name: JEMA DEEGAN MRN: [id number] DOB: 04-12-1935   Cancelled Treatment:     Per nsg, will hold treatment at this time. Will follow   Fabio Asa 07/21/2015, 8:01 AM Charlotte Crumb, PT DPT  [phone]

## 2015-07-21 NOTE — Progress Notes (Signed)
eLink Physician-Brief Progress Note Patient Name: Charlene Ellis DOB: Dec 05, 1935 MRN: [id number]   Date of Service  07/21/2015  HPI/Events of Note  Difficult to control A fib.  Low electrolytes.  eICU Interventions  Replace K, and Mg.  Add diltiazem gtt.  Continue amiodarone.  Continue BiPAP.      Intervention Category Major Interventions: Other:  Nicholaus Steinke 07/21/2015, 4:17 AM

## 2015-07-21 NOTE — Progress Notes (Signed)
ANTIBIOTIC CONSULT NOTE - INITIAL  Pharmacy Consult for vancomycin + zosyn Indication: rule out pneumonia  No Known Allergies  Patient Measurements: Height:  (162.6 cm) Weight: 146 lb 2.6 oz (66.3 kg) IBW/kg (Calculated) : 54.7 Adjusted Body Weight:   Vital Signs: Temp: 99.5 F (37.5 C) (08/03 0800) Temp Source: Axillary (08/03 0800) BP: 120/59 mmHg (08/03 1000) Pulse Rate: 94 (08/03 1000) Intake/Output from previous day: 08/02 0701 - 08/03 0700 In: 1571.5 [I.V.:721.5; NG/GT:490; IV Piggyback:350] Out: 1620 [Urine:1600; Stool:20] Intake/Output from this shift: Total I/O In: 401.4 [I.V.:95.1; NG/GT:206.3; IV Piggyback:100] Out: -   Labs:  Recent Labs  07/19/15 0440 07/19/15 2002 07/20/15 0640 07/20/15 0950 07/20/15 2358  WBC 13.9*  --   --   --   --   HGB 10.7*  --   --   --   --   PLT PLATELET CLUMPING, SUGGEST RECOLLECTION OF SAMPLE IN CITRATE TUBE.  --   --  193  --   CREATININE 0.74 0.89 0.85  --  0.73   Estimated Creatinine Clearance: 54.3 mL/min (by C-G formula based on Cr of 0.73). No results for input(s): VANCOTROUGH, VANCOPEAK, VANCORANDOM, GENTTROUGH, GENTPEAK, GENTRANDOM, TOBRATROUGH, TOBRAPEAK, TOBRARND, AMIKACINPEAK, AMIKACINTROU, AMIKACIN in the last 72 hours.   Microbiology: Recent Results (from the past 720 hour(s))  MRSA PCR Screening     Status: Abnormal   Collection Time: 06/28/2015  6:42 PM  Result Value Ref Range Status   MRSA by PCR POSITIVE (A) NEGATIVE Final    Comment:        The GeneXpert MRSA Assay (FDA approved for NASAL specimens only), is one component of a comprehensive MRSA colonization surveillance program. It is not intended to diagnose MRSA infection nor to guide or monitor treatment for MRSA infections. RESULT CALLED TO, READ BACK BY AND VERIFIED WITH: Ronne Binning RN 2140 06/19/2015 MITCHELL,L   Clostridium Difficile by PCR (not at [facility])     Status: None   Collection Time: 07/18/15 11:35 PM  Result Value Ref Range  Status   Toxigenic C Difficile by pcr NEGATIVE NEGATIVE Final    Medical History: Past Medical History  Diagnosis Date  . Hypertension   . Arthritis   . History of hip replacement   . History of breast augmentation     Medications:  Anti-infectives    Start     Dose/Rate Route Frequency Ordered Stop   Aug 16, 2015 0000  vancomycin (VANCOCIN) 500 mg in sodium chloride 0.9 % 100 mL IVPB     500 mg 100 mL/hr over 60 Minutes Intravenous Every 12 hours 07/21/15 1112     07/21/15 1200  piperacillin-tazobactam (ZOSYN) IVPB 3.375 g     3.375 g 12.5 mL/hr over 240 Minutes Intravenous Every 8 hours 07/21/15 1110     07/21/15 1200  vancomycin (VANCOCIN) IVPB 1000 mg/200 mL premix     1,000 mg 200 mL/hr over 60 Minutes Intravenous  Once 07/21/15 1112       Assessment: 80 yof presented to the hospital with large L MCA stroke. Now adding vancomycin + zosyn for possible pneumonia. Tmax is 100.9 and WBC 13.9 as of 8/1. SCr is WNL.  Vanc 8/3>> Zosyn 8/3>>  7/31 Cdiff - NEG 7/30 MRSA - POS  Goal of Therapy:  Vancomycin trough level 15-20 mcg/ml  Plan:  - Vanc 1gm IV x 1 then  IV Q12H - Zosyn 3.375gm IV Q8H (4 hr inf) - F/u renal fxn, C&S, clinical status and trough at [employer],  Drake Leach 07/21/2015,11:13 AM

## 2015-07-21 NOTE — Progress Notes (Signed)
PULMONARY / CRITICAL CARE MEDICINE HISTORY AND PHYSICAL EXAMINATION   Name: Charlene Ellis MRN: [id number] DOB: [birth year]    ADMISSION DATE:  08-14-2015  PRIMARY SERVICE: PCCM  CHIEF COMPLAINT:  Nonverbal  BRIEF PATIENT DESCRIPTION: 80 y/o woman with large L MCA stroke with late presentation  SIGNIFICANT EVENTS / STUDIES:  CTA brain 78/30 > dense left MCA occlusion, left common carotid artery occlusion CT brain perfusion scan 7/30 > large core infarct of majority of left MCA MR Brain 7/30: Large left MCA acute nonhemorrhagic infarct with mass effect and 4 mm midline shift. CT head 8/1 > L MCA stroke, worsening midline shift 8/2 Afib with RVR > later in the evening she developed respiratory failure   LINES / TUBES: - PIV - Foley 7/30 - L Early CVC 7/30  CULTURES: MRSA screen (+)  ANTIBIOTICS: None   SUBJECTIVE: Afib with RVR yesterday with worsening respiratory failure overnight, started on BIPAP  VITAL SIGNS: Temp:  [97.8 F (36.6 C)-100.9 F (38.3 C)] 99.5 F (37.5 C) (08/03 0800) Pulse Rate:  [37-197] 94 (08/03 1000) Resp:  [0-46] 21 (08/03 1000) BP: (116-194)/(54-130) 120/59 mmHg (08/03 1000) SpO2:  [98 %-100 %] 99 % (08/03 1000) FiO2 (%):  [40 %] 40 % (08/03 0829) Weight:  [66.3 kg (146 lb 2.6 oz)] 66.3 kg (146 lb 2.6 oz) (08/03 0500) HEMODYNAMICS:   VENTILATOR SETTINGS: Vent Mode:  [-] BIPAP FiO2 (%):  [40 %] 40 % Set Rate:  [12 bmp-15 bmp] 12 bmp PEEP:  [5 cmH20] 5 cmH20 INTAKE / OUTPUT: Intake/Output      08/02 0701 - 08/03 0700 08/03 0701 - 08/04 0700   I.V. (mL/kg) 721.5 (10.9) 95.1 (1.4)   Other 10    NG/GT 490 206.3   IV Piggyback 350 100   Total Intake(mL/kg) 1571.5 (23.7) 401.4 (6.1)   Urine (mL/kg/hr) 1600 (1)    Stool 20 (0)    Total Output 1620     Net -48.6 +401.4        Urine Occurrence  1 x     PHYSICAL EXAMINATION:  Gen: lying in bed, no distress HENT: scar over left eye PULM: Crackles in bases CV: RRR, no mgr GI: BS+, soft,  nontender Ext: trace edema, SCD Neuro: less responsive today; moves left arm and leg spontaneously but not too command  LABS:  CBC  Recent Labs Lab Aug 14, 2015 1501 07/18/15 0515 07/19/15 0440 07/20/15 0950  WBC 9.8 10.2 13.9*  --   HGB 11.7* 10.2* 10.7*  --   HCT 34.5* 30.3* 32.0*  --   PLT PLATELET CLUMPS NOTED ON SMEAR, COUNT APPEARS DECREASED PLATELET CLUMPING, SUGGEST RECOLLECTION OF SAMPLE IN CITRATE TUBE. PLATELET CLUMPING, SUGGEST RECOLLECTION OF SAMPLE IN CITRATE TUBE. 193   Coag's  Recent Labs Lab 08-14-2015 1501  INR 1.12   BMET  Recent Labs Lab 07/19/15 2002  07/20/15 0640  07/20/15 1645 07/20/15 2358 07/21/15 0500  NA 159*  < > 158*  < > 157* 157* 158*  K 3.3*  --  2.9*  --   --  2.9*  --   CL 130*  --  127*  --   --  125*  --   CO2 19*  --  22  --   --  23  --   BUN 23*  --  30*  --   --  29*  --   CREATININE 0.89  --  0.85  --   --  0.73  --   GLUCOSE  149*  --  171*  --   --  145*  --   < > = values in this interval not displayed. Electrolytes  Recent Labs Lab 07/19/15 2002 07/20/15 0640 07/20/15 0950 07/20/15 2358  CALCIUM 9.8 9.5  --  9.4  MG  --   --  2.0  --    Sepsis Markers  Recent Labs Lab 07/16/2015 1510  LATICACIDVEN 1.58   ABG No results for input(s): PHART, PCO2ART, PO2ART in the last 168 hours. Liver Enzymes  Recent Labs Lab 07/18/15 0515  AST 18  ALT 12*  ALKPHOS 79  BILITOT 0.8  ALBUMIN 3.0*   Cardiac Enzymes  Recent Labs Lab 07/18/15 1050 07/18/15 1712 07/18/15 2215  TROPONINI 0.14* 0.27* 0.34*   Glucose  Recent Labs Lab 07/20/15 1116 07/20/15 1523 07/20/15 2004 07/20/15 2351 07/21/15 0321 07/21/15 0815  GLUCAP 135* 156* 159* 131* 146* 179*    Imaging  CXR: 8/2 cardiomegally, worsening interstitial edema left subclavian vein CVL,  R breast implant 8/3 > improved aeration, no clear infiltrates  ASSESSMENT / PLAN:  Principal Problem:   Stroke Active Problems:   Hypertension   Acute ischemic  left MCA stroke   Stroke with cerebral ischemia   HLD (hyperlipidemia)  NEUROLOGIC A: Acute ischemic L MCA stroke, L carotid artery occlusion Cerebral edema with midline shift > worse on 8/2 P:   Per neurology ASA 3% saline per neurology to continue, goal Na 155-160 Repeat imaging, prognosis discussion per neurology > seems to be worsening see discussion below  PULMONARY A: Acute respiratory failure with hypoxemia> due to ? aspiration and pulmonary edema Poor airway protection P:   Maintain NPO BIPAP to continue Will discuss goals of care with family Lasix now Start coverage for HCAP/aspiration  CARDIOVASCULAR A: HTN post ischemic stroke Afib with RVR > acute onset 8/2 AM despite metoprolol, amiodarone, and diltiazem> cannot anticoagulate right now given large MCA stroke P:   Per stroke team BP goal is < 180 for now, will lower goal in 3-4 days Hydralazine prn Continue diltiazem gtt and amiodarone gtt Monitor BP carefully May need cardiology input if  Stat BMET/Mg  RENAL A: Theraputic hypernatremia Hypokalemia P:   Monitor BMET and UOP Replace electrolytes as needed STAT BMET and Magnesium  GASTROINTESTINAL A: Aspiration risk P:   NPO Hold tube feedings  HEMATOLOGIC A: Platelet clumping P:   Monitor for bleeding  INFECTIOUS A: Aspiration pneumonia? No clear findings on CXR but at risk and temp up P:   Start vanc/zosyn now  ENDOCRINE A: Hyperglycemia P:   Goal Glucose < 180 in setting of acute stroke SSI   BEST PRACTICE / DISPOSITION Level of Care:  ICU Primary Service:  PCCM Consultants:  Neurology Code Status:  DNR/DNI Diet:  NPO DVT Px:  Heparin GI Px:  None Skin Integrity:  Intact Social / Family:  Updated today   TODAY'S SUMMARY: 80 y/o woman with large L MCA stroke, DNR/DNI. Admitted to ICU for 78% saline.  CT 8/1 shows worsening edema.  Overall prognosis is grim, now in respiratory failure which would normally require  intubation.  Family not intereseted in this.  Will talk to them about comfort measures as she is clearly suffering now.  CC time today due her critically ill condition with multiple organ failures, high risk medications is 35 minutes   Heber [place], MD [place] PCCM Pager: [phone] Cell: [phone] After 3pm or if no response, call [phone]    07/21/2015, 10:46 AM

## 2015-07-21 NOTE — Progress Notes (Signed)
PT Cancellation Note  Patient Details Name: Charlene Ellis MRN: [id number] DOB: 1935-09-29   Cancelled Treatment:     Discontinued PT orders received, patient for comfort measures at this time. Will sign off.   Fabio Asa 07/21/2015, 4:32 PM Charlotte Crumb, PT DPT  [phone]

## 2015-07-21 NOTE — Progress Notes (Signed)
SLP Cancellation Note  Patient Details Name: Charlene Ellis MRN: [id number] DOB: [birth year]   Cancelled treatment:       Reason Eval/Treat Not Completed: Medical issues which prohibited therapy. Pt now on BiPAP. Per discussion with RN, will hold therapy at this time. Will f/u as able.   Maxcine Ham, M.A. CCC-SLP [phone]  Maxcine Ham 07/21/2015, 11:00 AM

## 2015-07-21 NOTE — Progress Notes (Signed)
eLink Physician-Brief Progress Note Patient Name: Charlene Ellis DOB: Jan 02, 1935 MRN: [id number]   Date of Service  07/21/2015  HPI/Events of Note  Patient well known to me from bedside work this week.  Condition has progressed, she is suffering. Lengthy conversation with family today, they understand the situation well.  They feel that she wuold not want aggressive measures and they want [place] to focus completely on her comfort.  eICU Interventions  Stop current orders Start morphine gtt Move to palliative medicine floor DNR     Intervention Category Major Interventions: End of life / care limitation discussion  Caitlynn Ju 07/21/2015, 4:28 PM

## 2015-07-22 MED ORDER — SCOPOLAMINE 1 MG/3DAYS TD PT72
1.0000 | MEDICATED_PATCH | TRANSDERMAL | Status: DC
  Administered 2015-07-22: 1.5 mg via TRANSDERMAL
  Filled 2015-07-22: qty 1

## 2015-07-22 MED ORDER — ATROPINE SULFATE 1 % OP SOLN
2.0000 [drp] | Freq: Three times a day (TID) | OPHTHALMIC | Status: DC | PRN
  Filled 2015-07-22: qty 5

## 2015-07-22 MED ORDER — ACETAMINOPHEN 650 MG RE SUPP
325.0000 mg | RECTAL | Status: DC | PRN
  Administered 2015-07-22: 325 mg via RECTAL

## 2015-07-23 ENCOUNTER — Telehealth: Payer: Self-pay

## 2015-07-23 NOTE — Telephone Encounter (Signed)
On 07/23/2015 I received a death certificate from [facility]. The patient is a patient of Doctor Sood. The death certificate will be taken to the pulmonoary unit for Doctor Sood to sign this afternoon. On 07/26/2015 I received a original copy of the death certificate from [facility]. The death certificate will be taken to the pulmonary unit this pm for signature. On 08/10/2015 I received the death certificate back from Doctor McQuaid who signed the death certificate for Charlene Ellis. I got the death certificate ready for pickup and called the funeral home to let them know I was mailing the death certificate to the [facility] Dept. I also faxed them a copy per their request.

## 2015-07-26 NOTE — Discharge Summary (Signed)
  Charlene Ellis was a 80 y.o. female admitted on 07/10/2015 with rt hemiplegia, Lt gaze preference, global aphasia from Lt MCA occlusion.  She was seen by neurology.  She was DNR.  Family was okay with hypertonic saline to see if this improves cerebral edema.  She developed difficulty to control A fib.  Her Echo showed grade 2 diastolic dysfunction, and PAS 58 mmHg.  She did not improve.  Family opted to transition to comfort care.  She subsequently expired on 08/12/2015.  Final Diagnoses:  Acute Lt MCA CVA from Lt carotid occlusion Cerebral Edema Medically induced hypernatremia Acute hypoxic respiratory failure from aspiration into airways Compromised airway A fib with RVR Hypokalemia Hyperglycemia Hx of HTN Chronic diastolic heart failure Secondary pulmonary hypertenion   Coralyn Helling, MD [facility] Pulmonary/Critical Care 07/26/2015, 8:38 AM

## 2015-08-19 NOTE — Progress Notes (Signed)
PULMONARY / CRITICAL CARE MEDICINE   Charlene Ellis is a 80 y.o. female admitted on 06/24/2015 with rt hemiplegia, Lt gaze preference, global aphasia from LT MCA occlusion.  She was seen by neurology.  She was DNR.  Family was okay with hypertonic saline to see if this improves cerebral edema.  She developed difficulty to control A fib.  Her Echo showed grade 2 diastolic dysfunction, and PAS 58 mmHg.  She did not improve.  Family opted to transition to comfort care.  SUBJECTIVE: Now comfort care, on palliative care floor.  Appears comfortable on morphine gtt /hr.  Wet "rattle" distressing to family per nurse.   VITAL SIGNS: Temp:  [98.1 F (36.7 C)-98.8 F (37.1 C)] 98.1 F (36.7 C) (08/03 2119) Pulse Rate:  [63-135] 135 (08/03 2119) Resp:  [12-33] 12 (08/03 2119) BP: (120-167)/(48-72) 154/54 mmHg (08/03 2119) SpO2:  [94 %-100 %] 94 % (08/03 2119) FiO2 (%):  [40 %] 40 % (08/03 1500)  PHYSICAL EXAMINATION:  Gen: lying in bed, appears comfortable.  HENT: increased oral secretions, very wet "rattle" with respirations PULM: resps shallow, agonal at times CV: RRR, no mgr GI: BS-, soft Ext: trace edema Neuro: unresponsive, grimaces occasionally but does not open eyes, comfortable on morphine gtt    ASSESSMENT:  Acute Lt MCA CVA from Lt carotid occlusion Cerebral Edema Medically induced hypernatremia Acute hypoxic respiratory failure from aspiration into airways Compromised airway A fib with RVR Hypokalemia Hyperglycemia Hx of HTN Chronic diastolic heart failure Secondary pulmonary hypertenion  PLAN:  DNR, DNI Scopalamine patch Morphine gtt  Dirk Dress, NP 2015-08-10  9:08 AM Pager: [phone] or [phone]  No family at bedside.  She appears comfortable.  Likely to pass within next 24 hours.  Coralyn Helling, MD [facility] Pulmonary/Critical Care 08-10-2015, 10:41 AM Pager:  [phone] After 3pm call: [phone]

## 2015-08-19 NOTE — Progress Notes (Signed)
Witnessed waste of 65cc morphine with Malen Gauze RN

## 2015-08-19 NOTE — Progress Notes (Signed)
Wasted 65 cc morphine Iv in sink. Witnessed by Lutricia Horsfall, RN

## 2015-08-19 NOTE — Progress Notes (Signed)
Nutrition Brief Note  Chart reviewed. Pt now transitioning to comfort care.  No further nutrition interventions warranted at this time.  Please re-consult as needed.   Jorah Hua A. Joi Leyva, RD, LDN, CDE Pager: [phone] After hours Pager: [phone]  

## 2015-08-19 NOTE — Care Management Note (Signed)
Case Management Note  Patient Details  Name: Charlene Ellis MRN: [id number] Date of Birth: September 16, 1935  Subjective/Objective:                    Action/Plan: UR updated   Expected Discharge Date:   (unknown)               Expected Discharge Plan:     In-House Referral:     Discharge planning Services     Post Acute Care Choice:    Choice offered to:     DME Arranged:    DME Agency:     HH Arranged:    HH Agency:     Status of Service:  In process, will continue to follow  Medicare Important Message Given:    Date Medicare IM Given:    Medicare IM give by:    Date Additional Medicare IM Given:    Additional Medicare Important Message give by:     If discussed at Long Length of Stay Meetings, dates discussed:  08-04-15   Additional Comments:  Kingsley Plan, RN 08/04/2015, 11:36 AM

## 2015-08-19 NOTE — Care Management Important Message (Signed)
Important Message  Patient Details  Name: Charlene Ellis MRN: [id number] Date of Birth: 24-Apr-1935   Medicare Important Message Given:  Other (see comment): Patient expired- spoke with NCM    Khylah Kendra P Neely Cecena 11-Aug-2015, 3:29 PM

## 2015-08-19 NOTE — Progress Notes (Signed)
Chaplain responded  to a consult request. Pt was asleep and family was resting. Chaplain will come back at an appropiate  time   08-11-15 1000  Clinical Encounter Type  Visited With Patient and family together  Visit Type Initial;Spiritual support  Referral From Nurse  Spiritual Encounters  Spiritual Needs Emotional;Grief support  Stress Factors  Patient Stress Factors Loss

## 2015-08-19 NOTE — Progress Notes (Signed)
RN informed by NT that patient had passed surrounded by family members. TOD 1330. [facility] notified. Pt is not a candidate to donate. Dr. Jamison Neighbor and Linus Galas. Notified. Informed that Dr. Craige Cotta would sign the Death Certificate. Post-mortem care complete. Await transport to [facility] in [place].

## 2015-08-19 DEATH — deceased

## 2016-11-12 IMAGING — CT CT HEAD W/O CM
1 series · 16 of 30 positions shown, 20 images · non-contrast
Comparison: 07/17/2015

CLINICAL DATA: Left MCA stroke.

EXAM:
CT HEAD WITHOUT CONTRAST
TECHNIQUE: Contiguous axial images were obtained from the base of the skull
through the vertex without intravenous contrast.

[Series 2: head 5.0 h30s · axial · 0.42mm/px · z∈[-202,-62]mm · 16 of 32 slices shown, 20 images]
[im 2/32  brain]
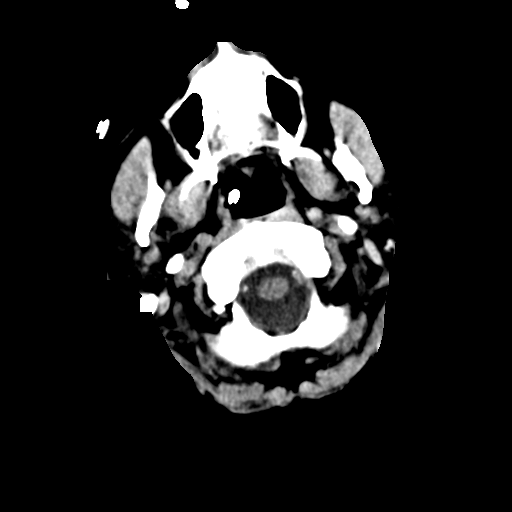
[im 2/32  bone]
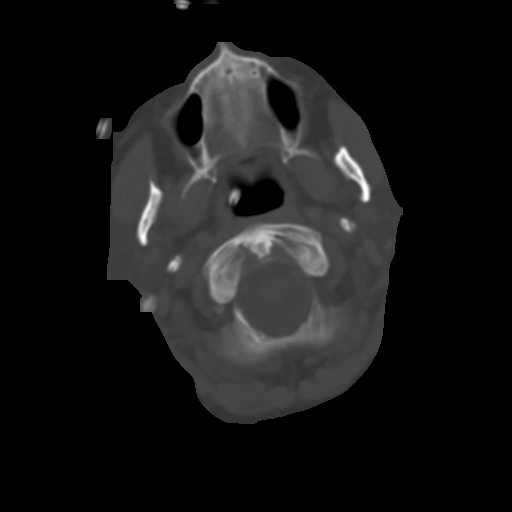
[im 4/32  brain]
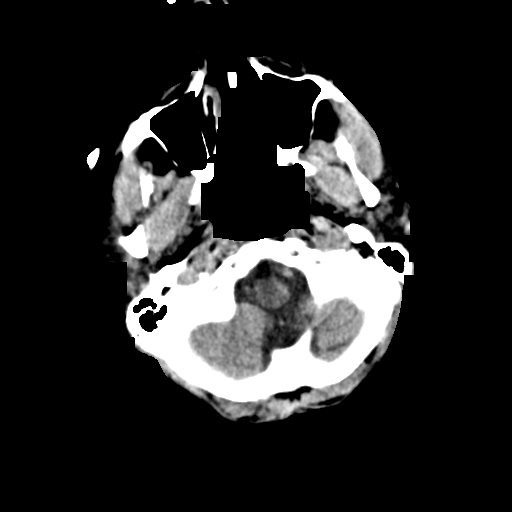
[im 6/32  brain]
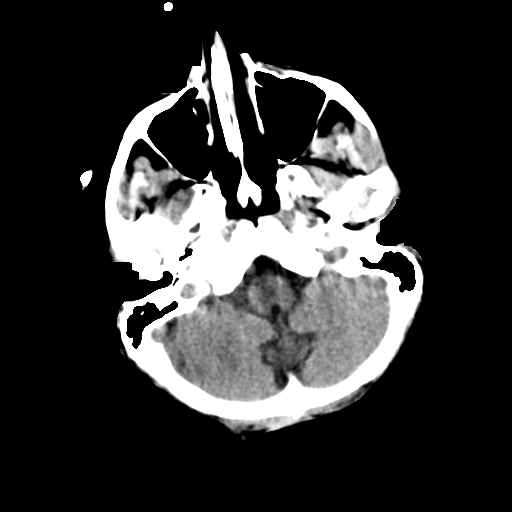
[im 8/32  brain]
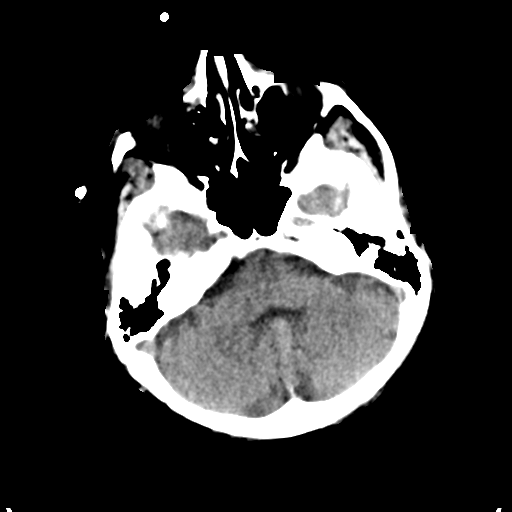
[im 9/32  brain]
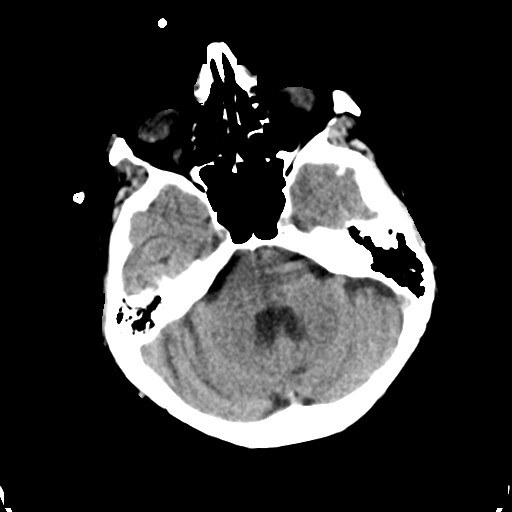
[im 9/32  bone]
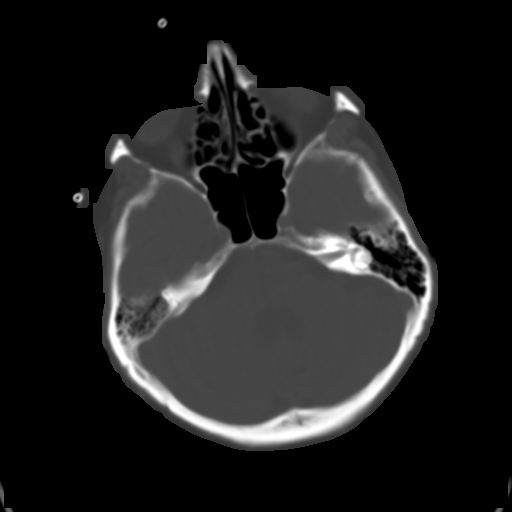
[im 11/32  brain]
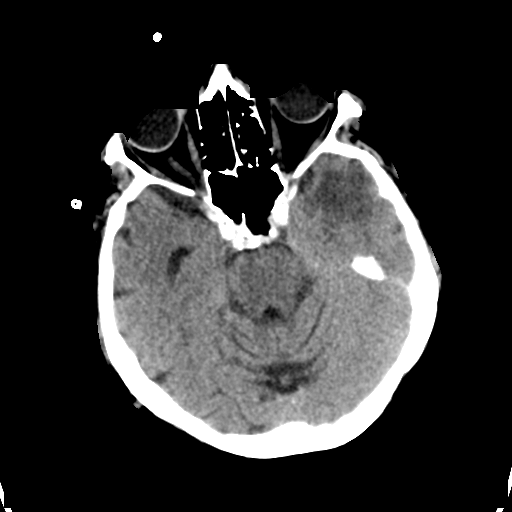
[im 13/32  brain]
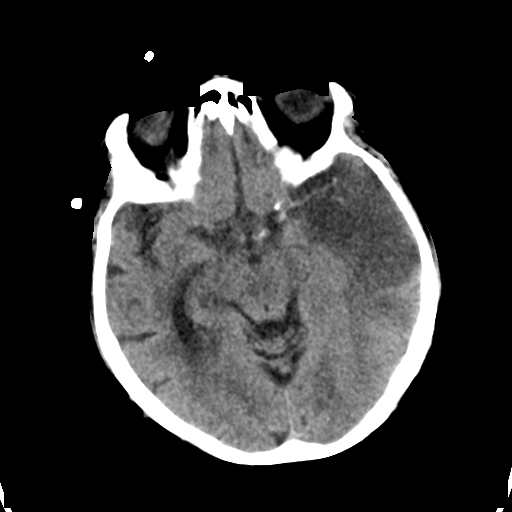
[im 15/32  brain]
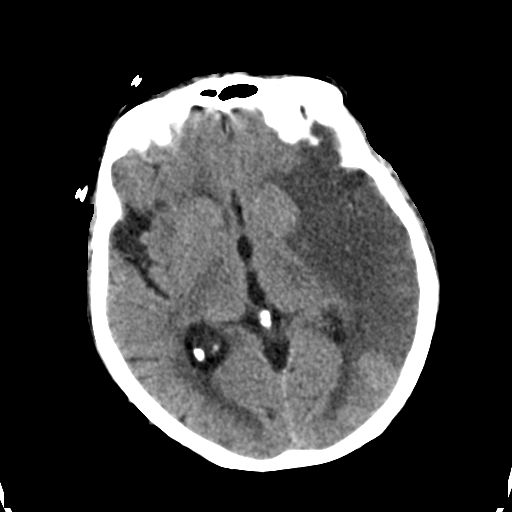
[im 17/32  brain]
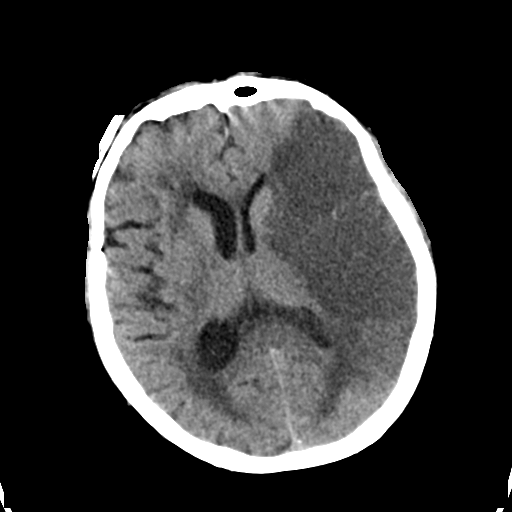
[im 17/32  bone]
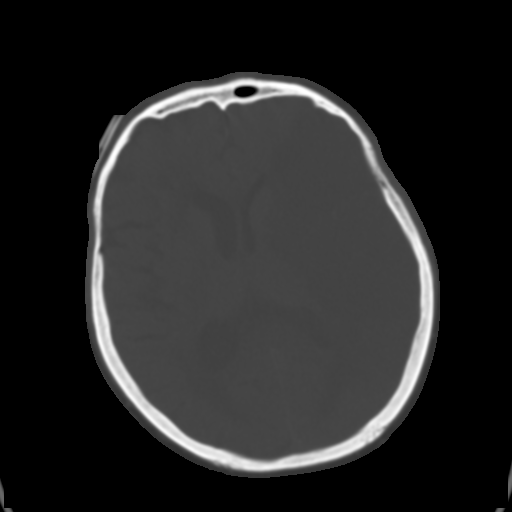
[im 19/32  brain]
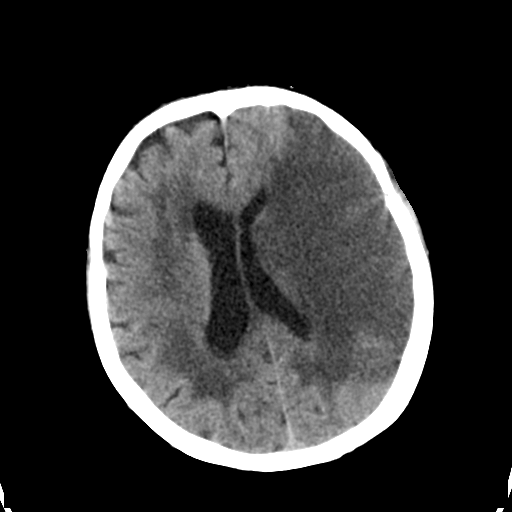
[im 21/32  brain]
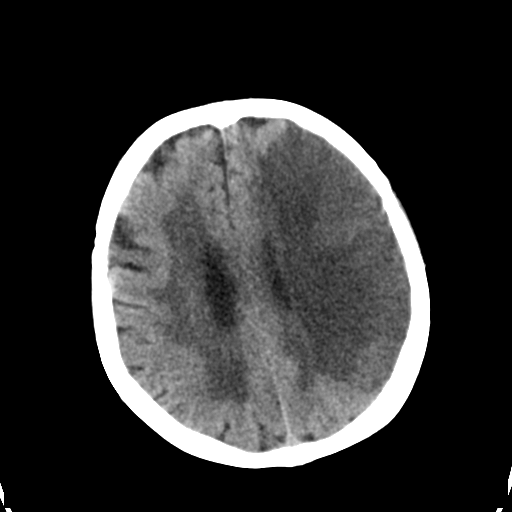
[im 23/32  brain]
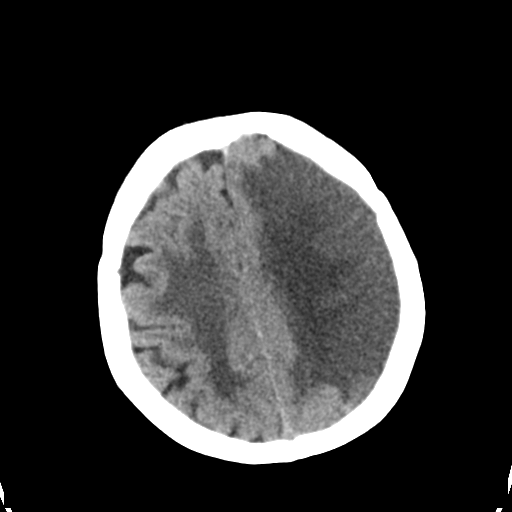
[im 24/32  brain]
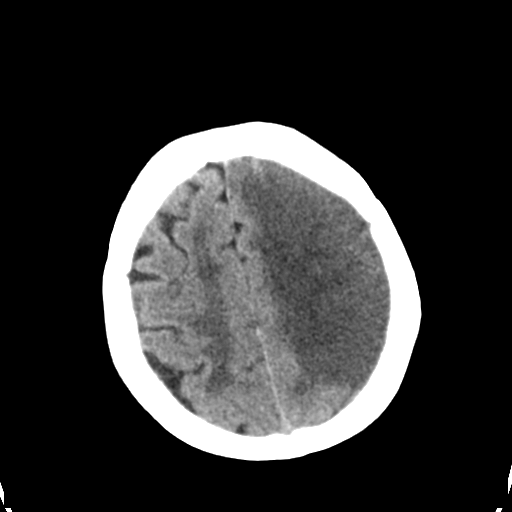
[im 24/32  bone]
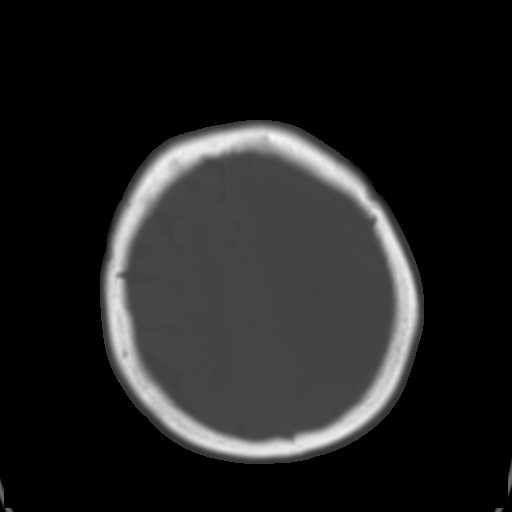
[im 26/32  brain]
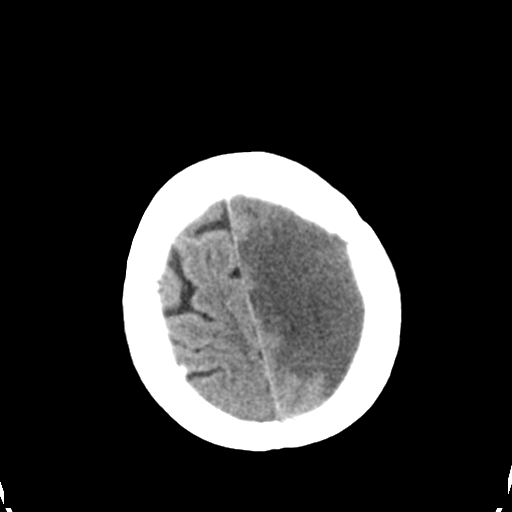
[im 28/32  brain]
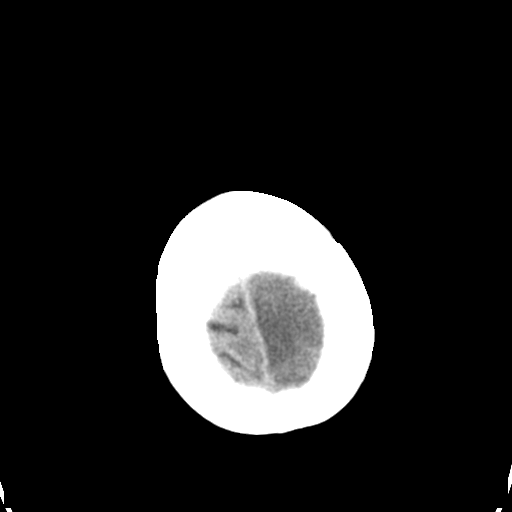
[im 30/32  brain]
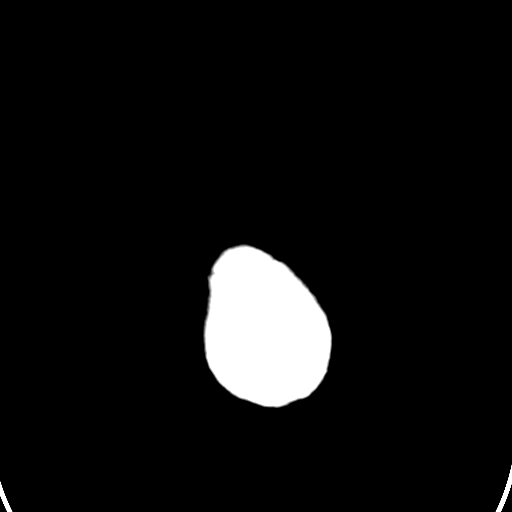

[16 of 30 positions shown; findings below may reference images not displayed]

FINDINGS: Extensive hypodensity in the left MCA territory compatible with
subacute infarction. This involves the lateral basal ganglia, as
well as portions the left frontal, temporal, and parietal lobe. No
associated hemorrhage.

Hyperdensity in the left middle cerebral artery branches likely
related to thrombosis.

Mild midline shift to the right of approximately 4 mm. Edema has
progressed and there is slight increase in midline shift.

Chronic microvascular ischemic changes throughout the cerebral white
matter on the right. Negative for hydrocephalus.

Calvarium intact.
IMPRESSION: Subacute infarct left MCA territory with edema and mass-effect. No
hemorrhage. Slight increase in midline shift since the prior study.

## 2016-11-14 IMAGING — DX DG CHEST 1V PORT
1 series · 1 of 1 positions shown · non-contrast
Comparison: Portable chest x-ray July 20, 2015.

CLINICAL DATA: Acute respiratory failure with hypoxia, history of
CVA

EXAM:
PORTABLE CHEST - 1 VIEW

[chest ap]
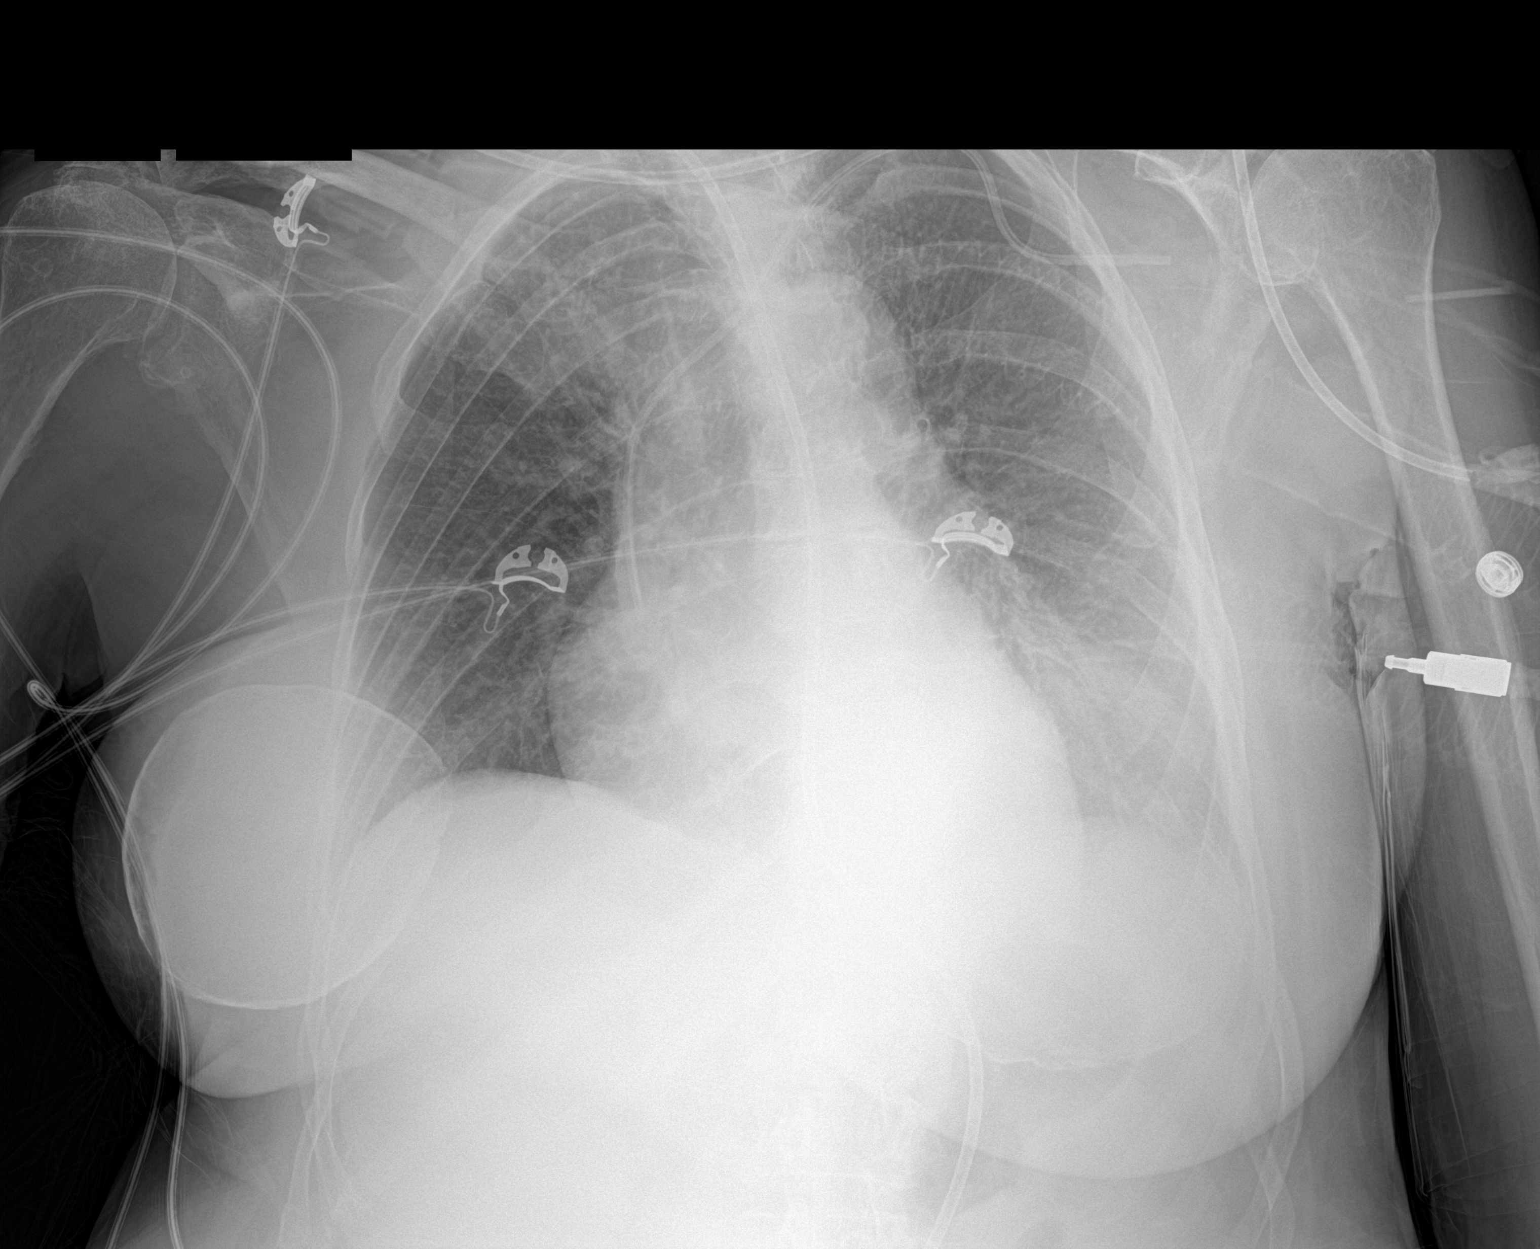

[1 of 1 positions shown; findings below may reference images not displayed]

FINDINGS: The lungs are adequately inflated. The interstitial markings remain
mildly prominent. The pulmonary vascularity is less engorged. The
cardiac silhouette is top-normal in size. The left subclavian venous
catheter tip projects over the midportion of the SVC. The feeding
tube tip projects below the inferior margin of the image. There are
bilateral breast implants exhibiting capsular calcification.
IMPRESSION: Interval improvement in the appearance of the pulmonary vascularity.
Mild interstitial prominence persists. There are no alveolar
infiltrates.
